# Patient Record
Sex: Male | Born: 1935 | Race: White | Hispanic: No | Marital: Married | Smoking: Never smoker
Health system: Southern US, Community
[De-identification: ages and names within clinical notes are randomized; demographics above are authoritative.]

## PROBLEM LIST (undated history)

## (undated) DIAGNOSIS — F32A Depression, unspecified: Secondary | ICD-10-CM

## (undated) DIAGNOSIS — H547 Unspecified visual loss: Secondary | ICD-10-CM

## (undated) DIAGNOSIS — F419 Anxiety disorder, unspecified: Secondary | ICD-10-CM

## (undated) DIAGNOSIS — Z950 Presence of cardiac pacemaker: Secondary | ICD-10-CM

## (undated) DIAGNOSIS — M1990 Unspecified osteoarthritis, unspecified site: Secondary | ICD-10-CM

## (undated) DIAGNOSIS — F329 Major depressive disorder, single episode, unspecified: Secondary | ICD-10-CM

## (undated) DIAGNOSIS — C801 Malignant (primary) neoplasm, unspecified: Secondary | ICD-10-CM

## (undated) DIAGNOSIS — J45909 Unspecified asthma, uncomplicated: Secondary | ICD-10-CM

## (undated) DIAGNOSIS — E119 Type 2 diabetes mellitus without complications: Secondary | ICD-10-CM

## (undated) HISTORY — PX: PACEMAKER INSERTION: SHX728

## (undated) HISTORY — DX: Unspecified asthma, uncomplicated: J45.909

## (undated) HISTORY — PX: BACK SURGERY: SHX140

## (undated) HISTORY — PX: COLONOSCOPY: SHX174

## (undated) HISTORY — PX: TONSILLECTOMY: SUR1361

## (undated) HISTORY — PX: KNEE ARTHROPLASTY: SHX992

## (undated) HISTORY — PX: CHOLECYSTECTOMY: SHX55

## (undated) HISTORY — PX: TRANSURETHRAL RESECTION OF PROSTATE: SHX73

## (undated) HISTORY — PX: EYE SURGERY: SHX253

## (undated) HISTORY — PX: MOHS SURGERY: SUR867

## (undated) HISTORY — PX: APPENDECTOMY: SHX54

---

## 2017-04-21 ENCOUNTER — Other Ambulatory Visit: Payer: Self-pay | Admitting: Neurosurgery

## 2017-05-15 NOTE — Pre-Procedure Instructions (Signed)
Jose Cervantes  05/15/2017    Your procedure is scheduled on Thursday, May 21, 2017 at 12:45 PM.   Report to [facility] Entrance "A" Admitting Office at 10:45 AM.   Call this number if you have problems the morning of surgery: [phone]   Questions prior to day of surgery, please call [phone] between 8 & 4 PM.   Remember:  Do not eat food or drink liquids after midnight Wednesday, 05/20/17.   Take these medicines the morning of surgery with A SIP OF WATER: Venlafaxine XR (Effexor-SR), Atorvastatin (Lipitor), Tylenol - if needed  Stop NSAIDS (Meloxicam, Ibuprofen, Aleve, etc) as of today. Do not use Aspirin products prior to surgery.   Do not wear jewelry.  Do not wear lotions, powders, cologne or deodorant.  Men may shave face and neck.  Do not bring valuables to the hospital.  [facility] is not responsible for any belongings or valuables.  Contacts, dentures or bridgework may not be worn into surgery.  Leave your suitcase in the car.  After surgery it may be brought to your room.  For patients admitted to the hospital, discharge time will be determined by your treatment team.  [facility] - Preparing for Surgery  Before surgery, you can play an important role.  Because skin is not sterile, your skin needs to be as free of germs as possible.  You can reduce the number of germs on you skin by washing with CHG (chlorahexidine gluconate) soap before surgery.  CHG is an antiseptic cleaner which kills germs and bonds with the skin to continue killing germs even after washing.  Please DO NOT use if you have an allergy to CHG or antibacterial soaps.  If your skin becomes reddened/irritated stop using the CHG and inform your nurse when you arrive at Short Stay.  Do not shave (including legs and underarms) for at least 48 hours prior to the first CHG shower.  You may shave your face.  Please follow these instructions carefully:   1.  Shower with CHG Soap the night  before surgery and the                    morning of Surgery.  2.  If you choose to wash your hair, wash your hair first as usual with your       normal shampoo.  3.  After you shampoo, rinse your hair and body thoroughly to remove the shampoo.  4.  Use CHG as you would any other liquid soap.  You can apply chg directly       to the skin and wash gently with scrungie or a clean washcloth.  5.  Apply the CHG Soap to your body ONLY FROM THE NECK DOWN.        Do not use on open wounds or open sores.  Avoid contact with your eyes, ears, mouth and genitals (private parts).  Wash genitals (private parts) with your normal soap.  6.  Wash thoroughly, paying special attention to the area where your surgery        will be performed.  7.  Thoroughly rinse your body with warm water from the neck down.  8.  DO NOT shower/wash with your normal soap after using and rinsing off       the CHG Soap.  9.  Pat yourself dry with a clean towel.            10.  Wear clean pajamas.  11.  Place clean sheets on your bed the night of your first shower and do not        sleep with pets.  Day of Surgery  Shower as above. Do not apply any lotions/deodorants the morning of surgery.  Please wear clean clothes to the hospital.   Please read over the fact sheets that you were given.

## 2017-05-18 ENCOUNTER — Encounter (HOSPITAL_COMMUNITY)
Admission: RE | Admit: 2017-05-18 | Discharge: 2017-05-18 | Disposition: A | Discharge status: Home / Self Care | Payer: Medicare Other | Source: Ambulatory Visit | Attending: Neurosurgery | Admitting: Neurosurgery

## 2017-05-18 ENCOUNTER — Other Ambulatory Visit: Payer: Self-pay

## 2017-05-18 ENCOUNTER — Encounter (HOSPITAL_COMMUNITY): Payer: Self-pay

## 2017-05-18 ENCOUNTER — Other Ambulatory Visit (HOSPITAL_COMMUNITY): Payer: Self-pay | Admitting: *Deleted

## 2017-05-18 DIAGNOSIS — M5416 Radiculopathy, lumbar region: Secondary | ICD-10-CM | POA: Diagnosis not present

## 2017-05-18 DIAGNOSIS — Z7984 Long term (current) use of oral hypoglycemic drugs: Secondary | ICD-10-CM | POA: Diagnosis not present

## 2017-05-18 DIAGNOSIS — E119 Type 2 diabetes mellitus without complications: Secondary | ICD-10-CM | POA: Diagnosis not present

## 2017-05-18 DIAGNOSIS — F329 Major depressive disorder, single episode, unspecified: Secondary | ICD-10-CM | POA: Diagnosis not present

## 2017-05-18 DIAGNOSIS — M48062 Spinal stenosis, lumbar region with neurogenic claudication: Secondary | ICD-10-CM | POA: Diagnosis present

## 2017-05-18 DIAGNOSIS — Z85828 Personal history of other malignant neoplasm of skin: Secondary | ICD-10-CM | POA: Diagnosis not present

## 2017-05-18 DIAGNOSIS — Z791 Long term (current) use of non-steroidal anti-inflammatories (NSAID): Secondary | ICD-10-CM | POA: Diagnosis not present

## 2017-05-18 HISTORY — DX: Anxiety disorder, unspecified: F41.9

## 2017-05-18 HISTORY — DX: Unspecified osteoarthritis, unspecified site: M19.90

## 2017-05-18 HISTORY — DX: Malignant (primary) neoplasm, unspecified: C80.1

## 2017-05-18 HISTORY — DX: Unspecified visual loss: H54.7

## 2017-05-18 HISTORY — DX: Major depressive disorder, single episode, unspecified: F32.9

## 2017-05-18 HISTORY — DX: Type 2 diabetes mellitus without complications: E11.9

## 2017-05-18 HISTORY — DX: Depression, unspecified: F32.A

## 2017-05-18 LAB — BASIC METABOLIC PANEL
Anion gap: 10 (ref 5–15)
BUN: 19 mg/dL (ref 6–20)
CALCIUM: 9.3 mg/dL (ref 8.9–10.3)
CO2: 22 mmol/L (ref 22–32)
CREATININE: 0.93 mg/dL (ref 0.61–1.24)
Chloride: 107 mmol/L (ref 101–111)
GFR calc Af Amer: >60 mL/min (ref >60)
GLUCOSE: 148 mg/dL — AB (ref 65–99)
Potassium: 4.4 mmol/L (ref 3.5–5.1)
SODIUM: 139 mmol/L (ref 135–145)

## 2017-05-18 LAB — CBC
HEMATOCRIT: 48 % (ref 39.0–52.0)
Hemoglobin: 16.5 g/dL (ref 13.0–17.0)
MCH: 32 pg (ref 26.0–34.0)
MCHC: 34.4 g/dL (ref 30.0–36.0)
MCV: 93.2 fL (ref 78.0–100.0)
PLATELETS: 145 10*3/uL — AB (ref 150–400)
RBC: 5.15 MIL/uL (ref 4.22–5.81)
RDW: 13 % (ref 11.5–15.5)
WBC: 7.8 10*3/uL (ref 4.0–10.5)

## 2017-05-18 LAB — HEMOGLOBIN A1C
HEMOGLOBIN A1C: 5.9 % — AB (ref 4.8–5.6)
MEAN PLASMA GLUCOSE: 122.63 mg/dL

## 2017-05-18 LAB — SURGICAL PCR SCREEN
MRSA, PCR: NEGATIVE
Staphylococcus aureus: NEGATIVE

## 2017-05-18 LAB — GLUCOSE, CAPILLARY: Glucose-Capillary: 174 mg/dL — ABNORMAL HIGH (ref 65–99)

## 2017-05-18 NOTE — Patient Instructions (Signed)
How to Manage Your Diabetes Before Surgery   Why is it important to control my blood sugar before and after surgery?   Improving blood sugar levels before and after surgery helps healing and can limit problems.  A way of improving blood sugar control is eating a healthy diet by:  - Eating less sugar and carbohydrates  - Increasing activity/exercise  - Talk with your doctor about reaching your blood sugar goals  High blood sugars (greater than 180 mg/dL) can raise your risk of infections and slow down your recovery so you will need to focus on controlling your diabetes during the weeks before surgery.  Make sure that the doctor who takes care of your diabetes knows about your planned surgery including the date and location.  How do I manage my blood sugars before surgery?   Check your blood sugar at least 4 times a day, 2 days before surgery to make sure that they are not too high or low.  Check your blood sugar the morning of your surgery when you wake up and every 2 hours until you get to the Short-Stay unit.  Treat a low blood sugar (less than 70 mg/dL) with 1/2 cup of clear juice (cranberry or apple), 4 glucose tablets, OR glucose gel.  Recheck blood sugar in 15 minutes after treatment (to make sure it is greater than 70 mg/dL).  If blood sugar is not greater than 70 mg/dL on re-check, call [phone] for further instructions.   Report your blood sugar to the Short-Stay nurse when you get to Short-Stay.  References:  [facility], 2007 "How to Manage your Diabetes Before and After Surgery".

## 2017-05-18 NOTE — Progress Notes (Signed)
Pt denies cardiac history, chest pain or sob. Pt denies HTN history. Pt is a type 2 diabetic. Last A1C was 5.4 on 02/24/17. He states his fasting blood sugar is usually between 109 -119.

## 2017-05-19 ENCOUNTER — Other Ambulatory Visit: Payer: Self-pay | Admitting: Neurosurgery

## 2017-05-21 ENCOUNTER — Ambulatory Visit (HOSPITAL_COMMUNITY): Payer: Medicare Other

## 2017-05-21 ENCOUNTER — Encounter (HOSPITAL_COMMUNITY): Payer: Self-pay | Admitting: Urology

## 2017-05-21 ENCOUNTER — Ambulatory Visit (HOSPITAL_COMMUNITY): Payer: Medicare Other | Admitting: Certified Registered"

## 2017-05-21 ENCOUNTER — Encounter (HOSPITAL_COMMUNITY)
Admission: RE | Disposition: A | Discharge status: Home / Self Care | Payer: Self-pay | Source: Ambulatory Visit | Attending: Neurosurgery

## 2017-05-21 ENCOUNTER — Inpatient Hospital Stay (HOSPITAL_COMMUNITY)
Admission: RE | Admit: 2017-05-21 | Discharge: 2017-05-21 | DRG: 517 | Disposition: A | Discharge status: Home / Self Care | Payer: Medicare Other | Source: Ambulatory Visit | Attending: Neurosurgery | Admitting: Neurosurgery

## 2017-05-21 DIAGNOSIS — E119 Type 2 diabetes mellitus without complications: Secondary | ICD-10-CM | POA: Diagnosis not present

## 2017-05-21 DIAGNOSIS — Z791 Long term (current) use of non-steroidal anti-inflammatories (NSAID): Secondary | ICD-10-CM

## 2017-05-21 DIAGNOSIS — M5416 Radiculopathy, lumbar region: Secondary | ICD-10-CM | POA: Diagnosis present

## 2017-05-21 DIAGNOSIS — M48062 Spinal stenosis, lumbar region with neurogenic claudication: Secondary | ICD-10-CM | POA: Diagnosis present

## 2017-05-21 DIAGNOSIS — Z85828 Personal history of other malignant neoplasm of skin: Secondary | ICD-10-CM | POA: Diagnosis not present

## 2017-05-21 DIAGNOSIS — Z7984 Long term (current) use of oral hypoglycemic drugs: Secondary | ICD-10-CM | POA: Diagnosis not present

## 2017-05-21 DIAGNOSIS — F329 Major depressive disorder, single episode, unspecified: Secondary | ICD-10-CM | POA: Diagnosis present

## 2017-05-21 DIAGNOSIS — Z419 Encounter for procedure for purposes other than remedying health state, unspecified: Secondary | ICD-10-CM

## 2017-05-21 HISTORY — PX: LUMBAR LAMINECTOMY/DECOMPRESSION MICRODISCECTOMY: SHX5026

## 2017-05-21 LAB — GLUCOSE, CAPILLARY
GLUCOSE-CAPILLARY: 106 mg/dL — AB (ref 65–99)
GLUCOSE-CAPILLARY: 158 mg/dL — AB (ref 65–99)
Glucose-Capillary: 112 mg/dL — ABNORMAL HIGH (ref 65–99)

## 2017-05-21 SURGERY — LUMBAR LAMINECTOMY/DECOMPRESSION MICRODISCECTOMY 3 LEVELS
Anesthesia: General | Site: Spine Lumbar

## 2017-05-21 MED ORDER — INSULIN ASPART 100 UNIT/ML ~~LOC~~ SOLN: 0.0000 [IU] | SUBCUTANEOUS | Status: DC

## 2017-05-21 MED ORDER — ALBUMIN HUMAN 5 % IV SOLN
INTRAVENOUS | Status: DC | PRN
  Administered 2017-05-21: 14:00:00 via INTRAVENOUS

## 2017-05-21 MED ORDER — PHENOL 1.4 % MT LIQD: 1.0000 | OROMUCOSAL | Status: DC | PRN

## 2017-05-21 MED ORDER — CEFAZOLIN SODIUM-DEXTROSE 2-3 GM-%(50ML) IV SOLR
INTRAVENOUS | Status: DC | PRN
  Administered 2017-05-21: 2 g via INTRAVENOUS

## 2017-05-21 MED ORDER — SODIUM CHLORIDE 0.9 % IV SOLN
INTRAVENOUS | Status: DC | PRN
  Administered 2017-05-21: 500 mL

## 2017-05-21 MED ORDER — 0.9 % SODIUM CHLORIDE (POUR BTL) OPTIME
TOPICAL | Status: DC | PRN
  Administered 2017-05-21: 1000 mL

## 2017-05-21 MED ORDER — DOCUSATE SODIUM 100 MG PO CAPS: 100.0000 mg | ORAL_CAPSULE | Freq: Two times a day (BID) | ORAL | Status: DC

## 2017-05-21 MED ORDER — FENTANYL CITRATE (PF) 250 MCG/5ML IJ SOLN
INTRAMUSCULAR | Status: DC | PRN
  Administered 2017-05-21: 100 ug via INTRAVENOUS
  Administered 2017-05-21 (×2): 25 ug via INTRAVENOUS
  Administered 2017-05-21 (×2): 50 ug via INTRAVENOUS

## 2017-05-21 MED ORDER — OXYCODONE HCL 5 MG PO TABS: 5.0000 mg | ORAL_TABLET | Freq: Once | ORAL | Status: DC | PRN

## 2017-05-21 MED ORDER — ONDANSETRON HCL 4 MG/2ML IJ SOLN: 4.0000 mg | Freq: Four times a day (QID) | INTRAMUSCULAR | Status: DC | PRN

## 2017-05-21 MED ORDER — FENTANYL CITRATE (PF) 100 MCG/2ML IJ SOLN: 25.0000 ug | INTRAMUSCULAR | Status: DC | PRN

## 2017-05-21 MED ORDER — LACTATED RINGERS IV SOLN
INTRAVENOUS | Status: DC
  Administered 2017-05-21 (×2): via INTRAVENOUS

## 2017-05-21 MED ORDER — ONDANSETRON HCL 4 MG/2ML IJ SOLN
INTRAMUSCULAR | Status: DC | PRN
  Administered 2017-05-21: 4 mg via INTRAVENOUS

## 2017-05-21 MED ORDER — ONDANSETRON HCL 4 MG/2ML IJ SOLN
INTRAMUSCULAR | Status: AC
  Filled 2017-05-21: qty 4

## 2017-05-21 MED ORDER — SUGAMMADEX SODIUM 200 MG/2ML IV SOLN
INTRAVENOUS | Status: DC | PRN
  Administered 2017-05-21: 200 mg via INTRAVENOUS

## 2017-05-21 MED ORDER — ONDANSETRON HCL 4 MG PO TABS: 4.0000 mg | ORAL_TABLET | Freq: Four times a day (QID) | ORAL | Status: DC | PRN

## 2017-05-21 MED ORDER — BACITRACIN ZINC 500 UNIT/GM EX OINT
TOPICAL_OINTMENT | CUTANEOUS | Status: AC
  Filled 2017-05-21: qty 28.35

## 2017-05-21 MED ORDER — PHENYLEPHRINE 40 MCG/ML (10ML) SYRINGE FOR IV PUSH (FOR BLOOD PRESSURE SUPPORT)
PREFILLED_SYRINGE | INTRAVENOUS | Status: DC | PRN
  Administered 2017-05-21: 120 ug via INTRAVENOUS

## 2017-05-21 MED ORDER — THROMBIN (RECOMBINANT) 5000 UNITS EX SOLR
OROMUCOSAL | Status: DC | PRN
  Administered 2017-05-21: 5 mL via TOPICAL

## 2017-05-21 MED ORDER — MENTHOL 3 MG MT LOZG: 1.0000 | LOZENGE | OROMUCOSAL | Status: DC | PRN

## 2017-05-21 MED ORDER — DOCUSATE SODIUM 100 MG PO CAPS: 100.0000 mg | ORAL_CAPSULE | Freq: Two times a day (BID) | ORAL | 0 refills | Status: DC

## 2017-05-21 MED ORDER — MORPHINE SULFATE (PF) 4 MG/ML IV SOLN: 4.0000 mg | INTRAVENOUS | Status: DC | PRN

## 2017-05-21 MED ORDER — ROCURONIUM BROMIDE 10 MG/ML (PF) SYRINGE
PREFILLED_SYRINGE | INTRAVENOUS | Status: DC | PRN
  Administered 2017-05-21: 50 mg via INTRAVENOUS

## 2017-05-21 MED ORDER — METFORMIN HCL 500 MG PO TABS
500.0000 mg | ORAL_TABLET | Freq: Two times a day (BID) | ORAL | Status: DC
  Filled 2017-05-21: qty 1

## 2017-05-21 MED ORDER — DEXAMETHASONE SODIUM PHOSPHATE 10 MG/ML IJ SOLN
INTRAMUSCULAR | Status: AC
  Filled 2017-05-21: qty 2

## 2017-05-21 MED ORDER — CYCLOBENZAPRINE HCL 10 MG PO TABS: 10.0000 mg | ORAL_TABLET | Freq: Three times a day (TID) | ORAL | Status: DC | PRN

## 2017-05-21 MED ORDER — CEFAZOLIN SODIUM-DEXTROSE 2-4 GM/100ML-% IV SOLN: 2.0000 g | Freq: Three times a day (TID) | INTRAVENOUS | Status: DC

## 2017-05-21 MED ORDER — MELOXICAM 7.5 MG PO TABS
15.0000 mg | ORAL_TABLET | Freq: Every day | ORAL | Status: DC
  Filled 2017-05-21: qty 2

## 2017-05-21 MED ORDER — SODIUM CHLORIDE 0.9% FLUSH: 3.0000 mL | Freq: Two times a day (BID) | INTRAVENOUS | Status: DC

## 2017-05-21 MED ORDER — OXYCODONE HCL 5 MG PO TABS: 10.0000 mg | ORAL_TABLET | ORAL | Status: DC | PRN

## 2017-05-21 MED ORDER — FENTANYL CITRATE (PF) 250 MCG/5ML IJ SOLN
INTRAMUSCULAR | Status: AC
Start: 2017-05-21 — End: 2017-05-21
  Filled 2017-05-21: qty 5

## 2017-05-21 MED ORDER — ACETAMINOPHEN 650 MG RE SUPP: 650.0000 mg | RECTAL | Status: DC | PRN

## 2017-05-21 MED ORDER — CYCLOBENZAPRINE HCL 10 MG PO TABS: 10.0000 mg | ORAL_TABLET | Freq: Three times a day (TID) | ORAL | 0 refills | Status: DC | PRN

## 2017-05-21 MED ORDER — HEMOSTATIC AGENTS (NO CHARGE) OPTIME
TOPICAL | Status: DC | PRN
  Administered 2017-05-21: 1 via TOPICAL

## 2017-05-21 MED ORDER — ATORVASTATIN CALCIUM 20 MG PO TABS
10.0000 mg | ORAL_TABLET | Freq: Every day | ORAL | Status: DC
  Filled 2017-05-21: qty 1

## 2017-05-21 MED ORDER — LIDOCAINE 2% (20 MG/ML) 5 ML SYRINGE
INTRAMUSCULAR | Status: AC
  Filled 2017-05-21: qty 10

## 2017-05-21 MED ORDER — BUPIVACAINE-EPINEPHRINE (PF) 0.5% -1:200000 IJ SOLN
INTRAMUSCULAR | Status: DC | PRN
  Administered 2017-05-21: 10 mL

## 2017-05-21 MED ORDER — PROPOFOL 10 MG/ML IV BOLUS
INTRAVENOUS | Status: DC | PRN
  Administered 2017-05-21: 120 mg via INTRAVENOUS
  Administered 2017-05-21 (×2): 20 mg via INTRAVENOUS

## 2017-05-21 MED ORDER — PROPOFOL 10 MG/ML IV BOLUS
INTRAVENOUS | Status: AC
  Filled 2017-05-21: qty 20

## 2017-05-21 MED ORDER — VENLAFAXINE HCL ER 75 MG PO CP24: 150.0000 mg | ORAL_CAPSULE | Freq: Every day | ORAL | Status: DC

## 2017-05-21 MED ORDER — THROMBIN 5000 UNITS EX SOLR
CUTANEOUS | Status: AC
  Filled 2017-05-21: qty 5000

## 2017-05-21 MED ORDER — OXYCODONE HCL 5 MG PO TABS: 5.0000 mg | ORAL_TABLET | ORAL | 0 refills | Status: DC | PRN

## 2017-05-21 MED ORDER — ACETAMINOPHEN 500 MG PO TABS
1000.0000 mg | ORAL_TABLET | Freq: Four times a day (QID) | ORAL | Status: DC
  Administered 2017-05-21: 1000 mg via ORAL
  Filled 2017-05-21 (×2): qty 2

## 2017-05-21 MED ORDER — LIDOCAINE 2% (20 MG/ML) 5 ML SYRINGE
INTRAMUSCULAR | Status: DC | PRN
  Administered 2017-05-21: 60 mg via INTRAVENOUS

## 2017-05-21 MED ORDER — BUPIVACAINE-EPINEPHRINE (PF) 0.5% -1:200000 IJ SOLN
INTRAMUSCULAR | Status: AC
  Filled 2017-05-21: qty 30

## 2017-05-21 MED ORDER — CEFAZOLIN SODIUM-DEXTROSE 2-4 GM/100ML-% IV SOLN
INTRAVENOUS | Status: AC
  Filled 2017-05-21: qty 100

## 2017-05-21 MED ORDER — BACITRACIN ZINC 500 UNIT/GM EX OINT
TOPICAL_OINTMENT | CUTANEOUS | Status: DC | PRN
  Administered 2017-05-21: 1 via TOPICAL

## 2017-05-21 MED ORDER — ROCURONIUM BROMIDE 10 MG/ML (PF) SYRINGE
PREFILLED_SYRINGE | INTRAVENOUS | Status: AC
  Filled 2017-05-21: qty 10

## 2017-05-21 MED ORDER — BISACODYL 10 MG RE SUPP: 10.0000 mg | Freq: Every day | RECTAL | Status: DC | PRN

## 2017-05-21 MED ORDER — DEXAMETHASONE SODIUM PHOSPHATE 10 MG/ML IJ SOLN
INTRAMUSCULAR | Status: DC | PRN
  Administered 2017-05-21: 10 mg via INTRAVENOUS

## 2017-05-21 MED ORDER — SODIUM CHLORIDE 0.9% FLUSH: 3.0000 mL | INTRAVENOUS | Status: DC | PRN

## 2017-05-21 MED ORDER — PHENYLEPHRINE HCL 10 MG/ML IJ SOLN
INTRAMUSCULAR | Status: DC | PRN
  Administered 2017-05-21: 25 ug/min via INTRAVENOUS

## 2017-05-21 MED ORDER — OXYCODONE HCL 5 MG PO TABS: 5.0000 mg | ORAL_TABLET | ORAL | Status: DC | PRN

## 2017-05-21 MED ORDER — ACETAMINOPHEN 325 MG PO TABS: 650.0000 mg | ORAL_TABLET | ORAL | Status: DC | PRN

## 2017-05-21 MED ORDER — OXYCODONE HCL 5 MG/5ML PO SOLN: 5.0000 mg | Freq: Once | ORAL | Status: DC | PRN

## 2017-05-21 SURGICAL SUPPLY — 57 items
BAG DECANTER FOR FLEXI CONT (MISCELLANEOUS) ×3 IMPLANT
BENZOIN TINCTURE PRP APPL 2/3 (GAUZE/BANDAGES/DRESSINGS) ×3 IMPLANT
BLADE CLIPPER SURG (BLADE) ×3 IMPLANT
BUR MATCHSTICK NEURO 3.0 LAGG (BURR) ×3 IMPLANT
BUR PRECISION FLUTE 6.0 (BURR) ×3 IMPLANT
CANISTER SUCT 3000ML PPV (MISCELLANEOUS) ×3 IMPLANT
CARTRIDGE OIL MAESTRO DRILL (MISCELLANEOUS) ×1 IMPLANT
CLOSURE WOUND 1/2 X4 (GAUZE/BANDAGES/DRESSINGS) ×1
DIFFUSER DRILL AIR PNEUMATIC (MISCELLANEOUS) ×3 IMPLANT
DRAPE LAPAROTOMY 100X72X124 (DRAPES) ×3 IMPLANT
DRAPE MICROSCOPE LEICA (MISCELLANEOUS) IMPLANT
DRAPE POUCH INSTRU U-SHP 10X18 (DRAPES) ×3 IMPLANT
DRAPE SHEET LG 3/4 BI-LAMINATE (DRAPES) ×3 IMPLANT
DRAPE SURG 17X23 STRL (DRAPES) ×12 IMPLANT
DRSG OPSITE POSTOP 4X6 (GAUZE/BANDAGES/DRESSINGS) ×3 IMPLANT
ELECT BLADE 4.0 EZ CLEAN MEGAD (MISCELLANEOUS) ×3
ELECT REM PT RETURN 9FT ADLT (ELECTROSURGICAL) ×3
ELECTRODE BLDE 4.0 EZ CLN MEGD (MISCELLANEOUS) ×1 IMPLANT
ELECTRODE REM PT RTRN 9FT ADLT (ELECTROSURGICAL) ×1 IMPLANT
GAUZE SPONGE 4X4 12PLY STRL (GAUZE/BANDAGES/DRESSINGS) IMPLANT
GAUZE SPONGE 4X4 16PLY XRAY LF (GAUZE/BANDAGES/DRESSINGS) IMPLANT
GLOVE BIO SURGEON STRL SZ7 (GLOVE) ×3 IMPLANT
GLOVE BIO SURGEON STRL SZ8 (GLOVE) ×3 IMPLANT
GLOVE BIO SURGEON STRL SZ8.5 (GLOVE) ×3 IMPLANT
GLOVE BIOGEL PI IND STRL 6.5 (GLOVE) ×1 IMPLANT
GLOVE BIOGEL PI INDICATOR 6.5 (GLOVE) ×2
GLOVE EXAM NITRILE LRG STRL (GLOVE) IMPLANT
GLOVE EXAM NITRILE XL STR (GLOVE) IMPLANT
GLOVE EXAM NITRILE XS STR PU (GLOVE) IMPLANT
GLOVE SURG SS PI 6.5 STRL IVOR (GLOVE) ×3 IMPLANT
GOWN STRL REUS W/ TWL LRG LVL3 (GOWN DISPOSABLE) ×2 IMPLANT
GOWN STRL REUS W/ TWL XL LVL3 (GOWN DISPOSABLE) ×1 IMPLANT
GOWN STRL REUS W/TWL 2XL LVL3 (GOWN DISPOSABLE) IMPLANT
GOWN STRL REUS W/TWL LRG LVL3 (GOWN DISPOSABLE) ×4
GOWN STRL REUS W/TWL XL LVL3 (GOWN DISPOSABLE) ×2
GRAFT DURAGEN MATRIX 1WX1L (Tissue) ×3 IMPLANT
KIT BASIN OR (CUSTOM PROCEDURE TRAY) ×3 IMPLANT
KIT TURNOVER KIT B (KITS) ×3 IMPLANT
NEEDLE HYPO 21X1.5 SAFETY (NEEDLE) IMPLANT
NEEDLE HYPO 22GX1.5 SAFETY (NEEDLE) ×3 IMPLANT
NS IRRIG 1000ML POUR BTL (IV SOLUTION) ×3 IMPLANT
OIL CARTRIDGE MAESTRO DRILL (MISCELLANEOUS) ×3
PACK LAMINECTOMY NEURO (CUSTOM PROCEDURE TRAY) ×3 IMPLANT
PAD ARMBOARD 7.5X6 YLW CONV (MISCELLANEOUS) ×15 IMPLANT
PATTIES SURGICAL .5 X1 (DISPOSABLE) IMPLANT
RUBBERBAND STERILE (MISCELLANEOUS) ×6 IMPLANT
SEALANT ADHERUS EXTEND TIP (MISCELLANEOUS) ×3 IMPLANT
SPONGE SURGIFOAM ABS GEL 100 (HEMOSTASIS) ×3 IMPLANT
SPONGE SURGIFOAM ABS GEL SZ50 (HEMOSTASIS) IMPLANT
STRIP CLOSURE SKIN 1/2X4 (GAUZE/BANDAGES/DRESSINGS) ×2 IMPLANT
SUT PROLENE 6 0 BV (SUTURE) ×3 IMPLANT
SUT VIC AB 1 CT1 18XBRD ANBCTR (SUTURE) ×2 IMPLANT
SUT VIC AB 1 CT1 8-18 (SUTURE) ×4
SUT VIC AB 2-0 CP2 18 (SUTURE) ×6 IMPLANT
TOWEL GREEN STERILE (TOWEL DISPOSABLE) ×3 IMPLANT
TOWEL GREEN STERILE FF (TOWEL DISPOSABLE) ×3 IMPLANT
WATER STERILE IRR 1000ML POUR (IV SOLUTION) ×3 IMPLANT

## 2017-05-21 NOTE — Anesthesia Procedure Notes (Signed)
Procedure Name: Intubation Date/Time: 05/21/2017 12:02 PM Performed by: Imagene Riches, CRNA Pre-anesthesia Checklist: Patient identified, Emergency Drugs available, Suction available and Patient being monitored Patient Re-evaluated:Patient Re-evaluated prior to induction Oxygen Delivery Method: Circle System Utilized Preoxygenation: Pre-oxygenation with 100% oxygen Induction Type: IV induction Ventilation: Mask ventilation without difficulty Laryngoscope Size: Miller and 2 Grade View: Grade II Tube type: Oral Tube size: 7.5 mm Number of attempts: 1 Airway Equipment and Method: Stylet and Oral airway Placement Confirmation: ETT inserted through vocal cords under direct vision,  positive ETCO2 and breath sounds checked- equal and bilateral Secured at: 24 cm Tube secured with: Tape Dental Injury: Teeth and Oropharynx as per pre-operative assessment

## 2017-05-21 NOTE — Op Note (Signed)
Brief history: The patient is an 84 year old white male whose had prior back surgery by another physician many years ago.  He has developed recurrent back, buttock and leg pain consistent with neurogenic claudication.  He has failed medical management and was worked up with a lumbar MRI which demonstrated spinal stenosis.  I discussed the various treatment options with the patient.  He has weighed the risks, benefits and alternatives of surgery and decided proceed with a lumbar laminectomy.  Preoperative diagnosis: Lumbar spinal stenosis with neurogenic claudication, lumbago, lumbar radiculopathy  Postoperative diagnosis: The same  Procedure: L3-4, L4-5 and L5-S1 laminectomy   Surgeon: Dr. Earle Gell  Asst.: None  Anesthesia: Gen. endotracheal  Estimated blood loss: 150 cc  Drains: None  Complications: None  Description of procedure: The patient was brought to the operating room by the anesthesia team. General endotracheal anesthesia was induced. The patient was turned to the prone position on the Wilson frame. The patient's lumbosacral region was then prepared with Betadine scrub and Betadine solution. Sterile drapes were applied.  I then injected the area to be incised with Marcaine with epinephrine solution. I then used a scalpel to make a linear midline incision over the L3-4, L4-5 and L5-S1 intervertebral disc space. I then used electrocautery to perform a bilateral subperiosteal dissection exposing the spinous process and lamina of L3, L4, L5 and the upper sacrum. We obtained intraoperative radiograph to confirm our location. I then inserted the [facility] retractor for exposure.  I began the decompression by incising the interspinous ligament at L3-4, L4-5 and L5-S1.  I used a Leksell rongeur to remove the L3, L4 and L5 spinous process.   I used a high-speed drill to perform a laminotomy at L3-4, L4-5 and L5-S1 bilaterally. I then used a Kerrison punches to widen the laminotomy and  removed the ligamentum flavum at L3-4, L4-5 and L5-S1 bilaterally. We then used microdissection to free up the thecal sac and the bilateral L4, L5 and S1 nerve root from the epidural tissue. I then used a Kerrison punch to perform a foraminotomy at about the bilateral L4, L5 and S1 nerve root.  I inspected the disc at L3-4, L4-5 and L5-S1 bilaterally.  There were no herniations.  I then palpated along the ventral surface of the thecal sac and along exit route of the bilateral L4, L5 and S1 nerve root and noted that the neural structures were well decompressed. This completed the decompression.  I noted the dura was somewhat thin at the L4-5 interspace dorsally, likely from a prior epidural injection.  I laid Gelfoam and DuraSeal over the thin dura.  We then obtained hemostasis using bipolar electrocautery. We irrigated the wound out with bacitracin solution. We then removed the retractor. We then reapproximated the patient's thoracolumbar fascia with interrupted #1 Vicryl suture. We then reapproximated the patient's subcutaneous tissue with interrupted 2-0 Vicryl suture. We then reapproximated patient's skin with Steri-Strips and benzoin. The was then coated with bacitracin ointment. The drapes were removed. The patient was subsequently returned to the supine position where they were extubated by the anesthesia team. The patient was then transported to the postanesthesia care unit in stable condition. All sponge instrument and needle counts were reportedly correct at the end of this case.

## 2017-05-21 NOTE — Progress Notes (Signed)
Subjective: The patient is alert and pleasant.  He looks well.  Objective: Vital signs in last 24 hours: Temp:  [97.5 F (36.4 C)] 97.5 F (36.4 C) (04/18 1111) Pulse Rate:  [68] 68 (04/18 1111) Resp:  [18] 18 (04/18 1111) BP: (145)/(82) 145/82 (04/18 1111) SpO2:  [99 %] 99 % (04/18 1111) Estimated body mass index is 31.55 kg/m as calculated from the following:   Height as of 05/18/17: 5\' 8"  (1.727 m).   Weight as of 05/18/17: 94.1 kg (207 lb 8 oz).   Intake/Output from previous day: No intake/output data recorded. Intake/Output this shift: Total I/O In: 1250 [I.V.:1000; IV Piggyback:250] Out: 350 [Blood:350]  Physical exam the patient is alert and pleasant.  He is moving his lower extremities well.  Lab Results: No results for input(s): WBC, HGB, HCT, PLT in the last 72 hours. BMET No results for input(s): NA, K, CL, CO2, GLUCOSE, BUN, CREATININE, CALCIUM in the last 72 hours.  Studies/Results: No results found.  Assessment/Plan: The patient is doing well.  I spoke with his wife.  LOS: 0 days     Ophelia Charter 05/21/2017, 3:13 PM

## 2017-05-21 NOTE — Discharge Summary (Signed)
Physician Discharge Summary  Patient ID: Jose Cervantes MRN: [id number] DOB/AGE: 82-Mar-1934 82 y.o.  Admit date: 05/21/2017 Discharge date: 05/21/2017  Admission Diagnoses:Lumbar spinal stenosis with neurogenic claudication, lumbar radiculopathy, lumago  Discharge Diagnoses:  The same Active Problems:   Lumbar stenosis with neurogenic claudication   Discharged Condition: good  Hospital Course:  I performed an L3-4, L4-5 and L5-S1 laminectomy and foraminotomy on the patient on 05/21/2017.    The patient's postoperative course was unremarkable.  On the day of surgery the patient requested discharge to home  With his wife who is a Marine scientist.  He was given written and oral discharge instructions.  Consults: physical therapy Significant Diagnostic Studies: none Treatments: L3-4, L4-5 and L5-S1 laminectomy and foraminotomy Discharge Exam: Blood pressure 137/89, pulse 76, temperature 98.6 F (37 C), temperature source Oral, resp. rate 20, SpO2 99 %.  the patient is alert and pleasant.  His strength is grossly normal in his lower extremities.  Disposition:   Home  Discharge Instructions     Remove dressing in 72 hours   Complete by:  As directed    Call MD for:  difficulty breathing, headache or visual disturbances   Complete by:  As directed    Call MD for:  extreme fatigue   Complete by:  As directed    Call MD for:  hives   Complete by:  As directed    Call MD for:  persistant dizziness or light-headedness   Complete by:  As directed    Call MD for:  persistant nausea and vomiting   Complete by:  As directed    Call MD for:  redness, tenderness, or signs of infection (pain, swelling, redness, odor or green/yellow discharge around incision site)   Complete by:  As directed    Call MD for:  severe uncontrolled pain   Complete by:  As directed    Call MD for:  temperature >100.4   Complete by:  As directed    Diet - low sodium heart healthy   Complete by:  As directed    Discharge instructions   Complete by:  As directed    Call [phone] for a followup appointment. Take a stool softener while you are using pain medications.   Driving Restrictions   Complete by:  As directed    Do not drive for 2 weeks.   Increase activity slowly   Complete by:  As directed    Lifting restrictions   Complete by:  As directed    Do not lift more than 5 pounds. No excessive bending or twisting.   May shower / Bathe   Complete by:  As directed    Remove the dressing for 3 days after surgery.  You may shower, but leave the incision alone.     Allergies as of 05/21/2017   No Known Allergies     Medication List    TAKE these medications   acetaminophen 325 MG tablet Commonly known as:  TYLENOL Take 650 mg by mouth every 6 (six) hours as needed (for pain.).   atorvastatin 10 MG tablet Commonly known as:  LIPITOR Take 10 mg by mouth daily.   cyclobenzaprine 10 MG tablet Commonly known as:  FLEXERIL Take 1 tablet (10 mg total) by mouth 3 (three) times daily as needed for muscle spasms.   docusate sodium 100 MG capsule Commonly known as:  COLACE Take 1 capsule (100 mg total) by mouth 2 (two) times daily.   meloxicam 15 MG tablet  Commonly known as:  MOBIC Take 15 mg by mouth daily.   metFORMIN 500 MG tablet Commonly known as:  GLUCOPHAGE Take 500 mg by mouth 2 (two) times daily.   oxyCODONE 5 MG immediate release tablet Commonly known as:  Oxy IR/ROXICODONE Take 1 tablet (5 mg total) by mouth every 4 (four) hours as needed for moderate pain ((score 4 to 6)).   SYSTANE 0.4-0.3 % Soln Generic drug:  Polyethyl Glycol-Propyl Glycol Place 1 drop into both eyes 3 (three) times daily as needed.   venlafaxine XR 150 MG 24 hr capsule Commonly known as:  EFFEXOR-XR Take 150 mg by mouth daily with breakfast.        Signed: Ophelia Charter 05/21/2017, 5:27 PM

## 2017-05-21 NOTE — Anesthesia Postprocedure Evaluation (Signed)
Anesthesia Post Note  Patient: Jose Cervantes  Procedure(s) Performed: LAMINECTOMY AND FORAMINOTOMY LUMBAR THREE- LUMBAR FOUR, LUMBAR FOUR- LUMBAR FIVE, LUMBAR FIVE- SACRAL ONE (N/A Spine Lumbar)     Patient location during evaluation: PACU Anesthesia Type: General Level of consciousness: awake and alert Pain management: pain level controlled Vital Signs Assessment: post-procedure vital signs reviewed and stable Respiratory status: spontaneous breathing, nonlabored ventilation, respiratory function stable and patient connected to nasal cannula oxygen Cardiovascular status: blood pressure returned to baseline and stable Postop Assessment: no apparent nausea or vomiting Anesthetic complications: no    Last Vitals:  Vitals:   05/21/17 1611 05/21/17 1620  BP:  137/89  Pulse:  76  Resp:  20  Temp: 36.6 C 37 C  SpO2:  99%    Last Pain:  Vitals:   05/21/17 1630  TempSrc:   PainSc: 2                  [name] S

## 2017-05-21 NOTE — Anesthesia Preprocedure Evaluation (Signed)
Anesthesia Evaluation  Patient identified by MRN, date of birth, ID band Patient awake    Reviewed: Allergy & Precautions, H&P , NPO status , Patient's Chart, lab work & pertinent test results  Airway Mallampati: II   Neck ROM: full    Dental   Pulmonary neg pulmonary ROS,    breath sounds clear to auscultation       Cardiovascular negative cardio ROS   Rhythm:regular Rate:Normal     Neuro/Psych PSYCHIATRIC DISORDERS Anxiety Depression    GI/Hepatic   Endo/Other  diabetes, Type 2  Renal/GU      Musculoskeletal  (+) Arthritis ,   Abdominal   Peds  Hematology   Anesthesia Other Findings   Reproductive/Obstetrics                             Anesthesia Physical Anesthesia Plan  ASA: II  Anesthesia Plan: General   Post-op Pain Management:    Induction: Intravenous  PONV Risk Score and Plan: 2 and Ondansetron, Dexamethasone and Treatment may vary due to age or medical condition  Airway Management Planned: Oral ETT  Additional Equipment:   Intra-op Plan:   Post-operative Plan: Extubation in OR  Informed Consent: I have reviewed the patients History and Physical, chart, labs and discussed the procedure including the risks, benefits and alternatives for the proposed anesthesia with the patient or authorized representative who has indicated his/her understanding and acceptance.     Plan Discussed with: CRNA, Anesthesiologist and Surgeon  Anesthesia Plan Comments:         Anesthesia Quick Evaluation

## 2017-05-21 NOTE — Transfer of Care (Signed)
Immediate Anesthesia Transfer of Care Note  Patient: Jose Cervantes  Procedure(s) Performed: LAMINECTOMY AND FORAMINOTOMY LUMBAR THREE- LUMBAR FOUR, LUMBAR FOUR- LUMBAR FIVE, LUMBAR FIVE- SACRAL ONE (N/A Spine Lumbar)  Patient Location: PACU  Anesthesia Type:General  Level of Consciousness: awake, alert  and oriented  Airway & Oxygen Therapy: Patient Spontanous Breathing and Patient connected to nasal cannula oxygen  Post-op Assessment: Report given to RN and Post -op Vital signs reviewed and stable  Post vital signs: Reviewed and stable  Last Vitals:  Vitals Value Taken Time  BP    Temp    Pulse 78 05/21/2017  3:40 PM  Resp    SpO2 100 % 05/21/2017  3:40 PM  Vitals shown include unvalidated device data.  Last Pain:  Vitals:   05/21/17 1111  TempSrc: Oral  PainSc:       Patients Stated Pain Goal: 0 (07/62/26 3335)  Complications: No apparent anesthesia complications

## 2017-05-21 NOTE — Progress Notes (Signed)
Pt doing well. Pt and wife given D/C instructions with verbal understanding. Pt's IV was removed prior to D/C. Pt's incision is clean and dry with no sign of infection. Pt D/C'd home via wheelchair @ 1920 per MD order. Pt is stable @ D/C and has no other needs at this time. Holli Humbles, RN

## 2017-05-21 NOTE — Discharge Instructions (Signed)
Call MD for: Difficulty breathing, headache or visual disturbances, extreme fatigue  °Call MD for: hives ° Call MD for: persistant dizziness or light-headedness ° Call MD for: persistant nausea and vomiting  °Call MD for: redness, tenderness, or signs of infection (pain, swelling, redness, odor or green/yellow discharge around incision site)  °Call MD for: severe uncontrolled pain  °Call MD for: temperature >100.4 Diet - low sodium heart healthy Discharge instructions  ° Call [phone] for a followup appointment. °  °Increase activity slowly Remove dressing in 48 hours  ° ° ° ° °Laminectomy, Care After °This sheet gives you information about how to care for yourself after your procedure. Your health care provider may also give you more specific instructions. If you have problems or questions, contact your health care provider. °What can I expect after the procedure? °After the procedure, it is common to have: °· Some pain around your incision area. °· Muscle tightening (spasms) across the back. ° °Follow these instructions at home: °Incision care °· Follow instructions from your health care provider about how to take care of your incision area. Make sure you: °? Wash your hands with soap and water before and after you apply medicine to the area or change your bandage (dressing). If soap and water are not available, use hand sanitizer. °? Change your dressing as told by your health care provider. °? Leave stitches (sutures), skin glue, or adhesive strips in place. These skin closures may need to stay in place for 2 weeks or longer. If adhesive strip edges start to loosen and curl up, you may trim the loose edges. Do not remove adhesive strips completely unless your health care provider tells you to do that. °· Check your incision area every day for signs of infection. Check for: °? More redness, swelling, or pain. °? More fluid or blood. °? Warmth. °? Pus or a bad smell. °Medicines °· Take over-the-counter and  prescription medicines only as told by your health care provider. °· If you were prescribed an antibiotic medicine, use it as told by your health care provider. Do not stop using the antibiotic even if you start to feel better. °Bathing °· Do not take baths, swim, or use a hot tub for 2 weeks, or until your incision has healed completely. °· If your health care provider approves, you may take showers after your dressing has been removed. °Activity °· Return to your normal activities as told by your health care provider. Ask your health care provider what activities are safe for you. °· Avoid bending or twisting at your waist. Always bend at your knees. °· Do not sit for more than 20-30 minutes at a time. Lie down or walk between periods of sitting. °· Do not lift anything that is heavier than 10 lb (4.5 kg) or the limit that your health care provider tells you, until he or she says that it is safe. °· Do not drive for 2 weeks after your procedure or for as long as your health care provider tells you. °· Do not drive or use heavy machinery while taking prescription pain medicine. °General instructions °· To prevent or treat constipation while you are taking prescription pain medicine, your health care provider may recommend that you: °? Drink enough fluid to keep your urine clear or pale yellow. °? Take over-the-counter or prescription medicines. °? Eat foods that are high in fiber, such as fresh fruits and vegetables, whole grains, and beans. °? Limit foods that are high in fat   and processed sugars, such as fried and sweet foods. °· Do breathing exercises as told. °· Keep all follow-up visits as told by your health care provider. This is important. °Contact a health care provider if: °· You have more redness, swelling, or pain around your incision area. °· Your incision feels warm to the touch. °· You are not able to return to activities or do exercises as told by your health care provider. °Get help right away  if: °· You have: °? More fluid or blood coming from your incision area. °? Pus or a bad smell coming from your incision area. °? Chills or a fever. °? Episodes of dizziness or fainting while standing. °· You develop a rash. °· You develop shortness of breath or you have difficulty breathing. °· You cannot control when you urinate or have a bowel movement. °· You become weak. °· You are not able to use your legs. °Summary °· After the procedure, it is common to have some pain around your incision area. You may also have muscle tightening (spasms) across the back. °· Follow instructions from your health care provider about how to care for your incision. °· Do not lift anything that is heavier than 10 lb (4.5 kg) or the limit that your health care provider tells you, until he or she says that it is safe. °· Contact your health care provider if you have more redness, swelling, or pain around your incision area or if your incision feels warm to the touch. These can be signs of infection. °This information is not intended to replace advice given to you by your health care provider. Make sure you discuss any questions you have with your health care provider. °Document Released: 08/09/2004 Document Revised: 09/06/2015 Document Reviewed: 07/08/2015 °[employer] Interactive Patient Education © 2018 [employer] ° °

## 2017-05-21 NOTE — H&P (Signed)
Subjective: The patient is an 84 year old white male who has complained of back and leg pain consistent with neurogenic claudication.  He has failed medical management and was worked up with a lumbar MRI.  This demonstrated the patient has spinal stenosis at L3-4, L4-5 and L5-S1.  I discussed the various treatment options with the patient including surgery.  He has weighed the risks, benefits, and alternatives of surgery and decided proceed with a lumbar laminectomy.  Past Medical History:  Diagnosis Date  . Anxiety   . Arthritis   . 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 Depression   . Diabetes mellitus without complication ([place])   . Vision decreased    right eye since birth    Past Surgical History:  Procedure Laterality Date  . APPENDECTOMY    . BACK SURGERY  [id number], 1984   lumbar laminectomy   . CHOLECYSTECTOMY    . COLONOSCOPY    . EYE SURGERY Left    cataract surgery with lens implant  . KNEE ARTHROPLASTY Bilateral   . MOHS SURGERY Left    ear  . TONSILLECTOMY      No Known Allergies  Social History   Tobacco Use  . Smoking status: Never Smoker  . Smokeless tobacco: Never Used  Substance Use Topics  . Alcohol use: Yes    Comment: social    Family History  Problem Relation Age of Onset  . Diabetes Mellitus II Mother   . Hypertension Mother   . Congestive Heart Failure Father   . Heart disease Father   . Breast cancer Sister   . Bone cancer Sister    Prior to Admission medications   Medication Sig Start Date End Date Taking? Authorizing Provider  acetaminophen (TYLENOL) 325 MG tablet Take 650 mg by mouth every 6 (six) hours as needed (for pain.).   Yes [provider]  atorvastatin (LIPITOR) 10 MG tablet Take 10 mg by mouth daily.   Yes [provider]  meloxicam (MOBIC) 15 MG tablet Take 15 mg by mouth daily.   Yes [provider]  metFORMIN (GLUCOPHAGE) 500 MG tablet Take 500 mg by mouth 2 (two) times daily.   Yes [provider]  Polyethyl Glycol-Propyl Glycol (SYSTANE) 0.4-0.3 % SOLN Place 1 drop into both eyes 3 (three) times daily as needed.   Yes [provider]  venlafaxine XR (EFFEXOR-XR) 150 MG 24 hr capsule Take 150 mg by mouth daily with breakfast.   Yes [provider]     Review of Systems  Positive ROS: As above  All other systems have been reviewed and were otherwise negative with the exception of those mentioned in the HPI and as above.  Objective: Vital signs in last 24 hours: Temp:  [97.5 F (36.4 C)] 97.5 F (36.4 C) (04/18 1111) Pulse Rate:  [68] 68 (04/18 1111) Resp:  [18] 18 (04/18 1111) BP: (145)/(82) 145/82 (04/18 1111) SpO2:  [99 %] 99 % (04/18 1111) Estimated body mass index is 31.55 kg/m as calculated from the following:   Height as of 05/18/17: 5\' 8"  (1.727 m).   Weight as of 05/18/17: 94.1 kg (207 lb 8 oz).   General Appearance: Alert Head: Normocephalic, without obvious abnormality, atraumatic Eyes: PERRL, conjunctiva/corneas clear, EOM's intact,    Ears: Normal  Throat: Normal  Neck: Supple, Back: unremarkable Lungs: Clear to auscultation bilaterally, respirations unlabored Heart: Regular rate and rhythm, no murmur, rub or gallop Abdomen: Soft, non-tender Extremities: Extremities  normal, atraumatic, no cyanosis or edema Skin: unremarkable  NEUROLOGIC:   Mental status: alert and oriented,Motor Exam - grossly normal Sensory Exam - grossly normal Reflexes:  Coordination - grossly normal Gait - grossly normal Balance - grossly normal Cranial Nerves: I: smell Not tested  II: visual acuity  OS: Normal  OD: Normal   II: visual fields Full to confrontation  II: pupils Equal, round, reactive to light  III,VII: ptosis None  III,IV,VI: extraocular muscles  Full ROM  V: mastication Normal  V: facial light touch sensation  Normal  V,VII: corneal reflex  Present  VII: facial muscle function - upper  Normal  VII: facial muscle function -  lower Normal  VIII: hearing Not tested  IX: soft palate elevation  Normal  IX,X: gag reflex Present  XI: trapezius strength  5/5  XI: sternocleidomastoid strength 5/5  XI: neck flexion strength  5/5  XII: tongue strength  Normal    Data Review Lab Results  Component Value Date   WBC 7.8 05/18/2017   HGB 16.5 05/18/2017   HCT 48.0 05/18/2017   MCV 93.2 05/18/2017   PLT 145 (L) 05/18/2017   Lab Results  Component Value Date   NA 139 05/18/2017   K 4.4 05/18/2017   CL 107 05/18/2017   CO2 22 05/18/2017   BUN 19 05/18/2017   CREATININE 0.93 05/18/2017   GLUCOSE 148 (H) 05/18/2017   No results found for: INR, PROTIME  Assessment/Plan: L3-4, L4-5 and L5-S1 spinal stenosis, lumbago, lumbar radiculopathy, neurogenic claudication: I have discussed the situation with the patient and reviewed his imaging studies with him.  We have discussed the various treatment options including surgery.  I have described the surgical treatment option of an L3-4, L4-5 and L5-S1 laminectomy/laminotomy/foraminotomies.  I have shown him surgical models.  I have given him a surgical pamphlet.  We have discussed the risks, benefits, alternatives, expected postoperative course, and likelihood of achieving our goals with surgery.  I have answered all his questions.  He has decided to proceed with surgery.   Ophelia Charter 05/21/2017 11:21 AM

## 2017-05-22 MED FILL — Thrombin For Soln 5000 Unit: CUTANEOUS | Qty: 5000 | Status: AC

## 2017-05-24 ENCOUNTER — Encounter (HOSPITAL_COMMUNITY): Payer: Self-pay | Admitting: Neurosurgery

## 2017-05-26 ENCOUNTER — Other Ambulatory Visit (HOSPITAL_COMMUNITY): Payer: Self-pay

## 2017-08-29 ENCOUNTER — Emergency Department (HOSPITAL_BASED_OUTPATIENT_CLINIC_OR_DEPARTMENT_OTHER): Payer: Medicare Other

## 2017-08-29 ENCOUNTER — Emergency Department (HOSPITAL_BASED_OUTPATIENT_CLINIC_OR_DEPARTMENT_OTHER)
Admission: EM | Admit: 2017-08-29 | Discharge: 2017-08-29 | Disposition: A | Discharge status: Home / Self Care | Payer: Medicare Other | Attending: Emergency Medicine | Admitting: Emergency Medicine

## 2017-08-29 ENCOUNTER — Encounter (HOSPITAL_BASED_OUTPATIENT_CLINIC_OR_DEPARTMENT_OTHER): Payer: Self-pay | Admitting: Emergency Medicine

## 2017-08-29 ENCOUNTER — Other Ambulatory Visit: Payer: Self-pay

## 2017-08-29 DIAGNOSIS — Z85828 Personal history of other malignant neoplasm of skin: Secondary | ICD-10-CM | POA: Insufficient documentation

## 2017-08-29 DIAGNOSIS — E119 Type 2 diabetes mellitus without complications: Secondary | ICD-10-CM | POA: Diagnosis not present

## 2017-08-29 DIAGNOSIS — J069 Acute upper respiratory infection, unspecified: Secondary | ICD-10-CM

## 2017-08-29 DIAGNOSIS — R05 Cough: Secondary | ICD-10-CM | POA: Diagnosis present

## 2017-08-29 DIAGNOSIS — Z79899 Other long term (current) drug therapy: Secondary | ICD-10-CM | POA: Insufficient documentation

## 2017-08-29 DIAGNOSIS — Z7984 Long term (current) use of oral hypoglycemic drugs: Secondary | ICD-10-CM | POA: Diagnosis not present

## 2017-08-29 DIAGNOSIS — B9789 Other viral agents as the cause of diseases classified elsewhere: Secondary | ICD-10-CM

## 2017-08-29 NOTE — Discharge Instructions (Addendum)
If your fever does not go away or you develop shortness of breath, vomiting, or any other new/concerning symptoms then return to the ER for evaluation.

## 2017-08-29 NOTE — ED Triage Notes (Signed)
Fever and cough since yesterday. Just returned from a cruise.

## 2017-08-29 NOTE — ED Provider Notes (Signed)
[facility] EMERGENCY DEPARTMENT Provider Note   CSN: [id number] Arrival date & time: 08/29/17  1111     History   Chief Complaint Chief Complaint  Patient presents with  . Cough  . Fever    HPI Jose Cervantes is a 84 y.o. male.  HPI  84 year old male with a history of diabetes presents with fever and cough.  Started having the cough 2 days ago.  It is a dry cough but persistent and causing his lower chest/upper abdomen to hurt when coughing.  He does not feel short of breath.  He had a fever this morning of 101.  He took some Zicam this morning and has taken Tylenol.  He has not had any significant body aches but has had head congestion, rhinorrhea and sore throat.  He was on a cruise when this started and states he was interacting with someone who was diagnosed with influenza and treated with Tamiflu while on the ship.  He also was interacting with someone who was diagnosed with a cold.  Past Medical History:  Diagnosis Date  . Anxiety   . Arthritis   . Cancer ([place])    Basal cell cancer on ear  . Depression   . Diabetes mellitus without complication ([place])   . Vision decreased    right eye since birth    Patient Active Problem List   Diagnosis Date Noted  . Lumbar stenosis with neurogenic claudication 05/21/2017    Past Surgical History:  Procedure Laterality Date  . APPENDECTOMY    . BACK SURGERY  [id number], 1984   lumbar laminectomy   . CHOLECYSTECTOMY    . COLONOSCOPY    . EYE SURGERY Left    cataract surgery with lens implant  . KNEE ARTHROPLASTY Bilateral   . LUMBAR LAMINECTOMY/DECOMPRESSION MICRODISCECTOMY N/A 05/21/2017   Procedure: LAMINECTOMY AND FORAMINOTOMY LUMBAR THREE- LUMBAR FOUR, LUMBAR FOUR- LUMBAR FIVE, LUMBAR FIVE- SACRAL ONE;  Surgeon: Newman Pies, MD;  Location: [facility];  Service: Neurosurgery;  Laterality: N/A;  . MOHS SURGERY Left    ear  . TONSILLECTOMY          Home Medications    Prior to Admission medications     Medication Sig Start Date End Date Taking? Authorizing Provider  acetaminophen (TYLENOL) 325 MG tablet Take 650 mg by mouth every 6 (six) hours as needed (for pain.).    [provider]  atorvastatin (LIPITOR) 10 MG tablet Take 10 mg by mouth daily.    [provider]  cyclobenzaprine (FLEXERIL) 10 MG tablet Take 1 tablet (10 mg total) by mouth 3 (three) times daily as needed for muscle spasms. 05/21/17   Newman Pies, MD  docusate sodium (COLACE) 100 MG capsule Take 1 capsule (100 mg total) by mouth 2 (two) times daily. 05/21/17   Newman Pies, MD  meloxicam (MOBIC) 15 MG tablet Take 15 mg by mouth daily.    [provider]  metFORMIN (GLUCOPHAGE) 500 MG tablet Take 500 mg by mouth 2 (two) times daily.    [provider]  oxyCODONE (OXY IR/ROXICODONE) 5 MG immediate release tablet Take 1 tablet (5 mg total) by mouth every 4 (four) hours as needed for moderate pain ((score 4 to 6)). 05/21/17   Newman Pies, MD  Polyethyl Glycol-Propyl Glycol (SYSTANE) 0.4-0.3 % SOLN Place 1 drop into both eyes 3 (three) times daily as needed.    [provider]  venlafaxine XR (EFFEXOR-XR) 150 MG 24 hr capsule Take 150 mg by mouth  daily with breakfast.    [provider]    Family History Family History  Problem Relation Age of Onset  . Diabetes Mellitus II Mother   . Hypertension Mother   . Congestive Heart Failure Father   . Heart disease Father   . Breast cancer Sister   . Bone cancer Sister     Social History Social History   Tobacco Use  . Smoking status: Never Smoker  . Smokeless tobacco: Never Used  Substance Use Topics  . Alcohol use: Yes    Comment: social  . Drug use: Never     Allergies   Patient has no known allergies.   Review of Systems Review of Systems  Constitutional: Positive for fever.  HENT: Positive for congestion, rhinorrhea and sore throat.   Respiratory: Positive for cough. Negative for shortness of  breath.   Musculoskeletal: Positive for myalgias (mild).  All other systems reviewed and are negative.    Physical Exam Updated Vital Signs BP (!) 170/88 (BP Location: Left Arm)   Pulse 94   Temp (!) 101.3 F (38.5 C) (Oral)   Resp 20   Ht 5\' 8"  (1.727 m)   Wt 92.1 kg (203 lb)   SpO2 98%   BMI 30.87 kg/m   Physical Exam  Constitutional: He is oriented to person, place, and time. He appears well-developed and well-nourished. No distress.  HENT:  Head: Normocephalic and atraumatic.  Right Ear: External ear normal.  Left Ear: External ear normal.  Nose: Nose normal.  Mouth/Throat: Uvula is midline. No uvula swelling. Posterior oropharyngeal erythema (mild) present. No oropharyngeal exudate.  Eyes: Right eye exhibits no discharge. Left eye exhibits no discharge.  Neck: Neck supple.  Cardiovascular: Normal rate, regular rhythm and normal heart sounds.  Pulmonary/Chest: Effort normal and breath sounds normal. He has no wheezes. He has no rales.  Abdominal: Soft. There is no tenderness.  Musculoskeletal: He exhibits no edema.  Neurological: He is alert and oriented to person, place, and time.  Skin: Skin is warm and dry. He is not diaphoretic.  Nursing note and vitals reviewed.    ED Treatments / Results  Labs (all labs ordered are listed, but only abnormal results are displayed) Labs Reviewed - No data to display  EKG None  Radiology Dg Chest 2 View  Result Date: 08/29/2017 CLINICAL DATA:  Cough and fever following recent travel EXAM: CHEST - 2 VIEW COMPARISON:  03/15/2015 FINDINGS: The heart size and mediastinal contours are within normal limits. Both lungs are clear. The visualized skeletal structures are unremarkable. IMPRESSION: No active cardiopulmonary disease. Electronically Signed   By: Inez Catalina M.D.   On: 08/29/2017 11:51    Procedures Procedures (including critical care time)  Medications Ordered in ED Medications - No data to display   Initial  Impression / Assessment and Plan / ED Course  I have reviewed the triage vital signs and the nursing notes.  Pertinent labs & imaging results that were available during my care of the patient were reviewed by me and considered in my medical decision making (see chart for details).     Patient tells me that the person with the flu on the cruise ship actually tested positive for the flu.  I offered Tamiflu as he could have a mild influenza versus some other viral respiratory infection.  However after discussion of risks/benefits he declines.  I do not think flu testing in this scenario would be very helpful then.  Chest x-ray and lungs  are clear.  I do not think this is bacterial.  I think otherwise he needs supportive care for what is likely a viral upper respiratory infection.  We discussed over-the-counter remedies.  Return precautions.  Final Clinical Impressions(s) / ED Diagnoses   Final diagnoses:  Viral upper respiratory tract infection with cough    ED Discharge Orders    None       Sherwood Gambler, MD 08/29/17 1545

## 2017-08-29 NOTE — ED Notes (Signed)
Pt's wife requests blanket for pt because he has chills; temp rechecked, 101.3. Sheet provided to pt d/t fever.

## 2018-11-25 DIAGNOSIS — M25562 Pain in left knee: Secondary | ICD-10-CM | POA: Insufficient documentation

## 2019-06-08 IMAGING — CR DG LUMBAR SPINE 2-3V
2 series · 2 of 2 positions shown · non-contrast
Comparison: MRI lumbar spine April 14, 2017

CLINICAL DATA: Localization for laminectomy.

EXAM:
LUMBAR SPINE - 2-3 VIEW

[lateral (1 of 2)]
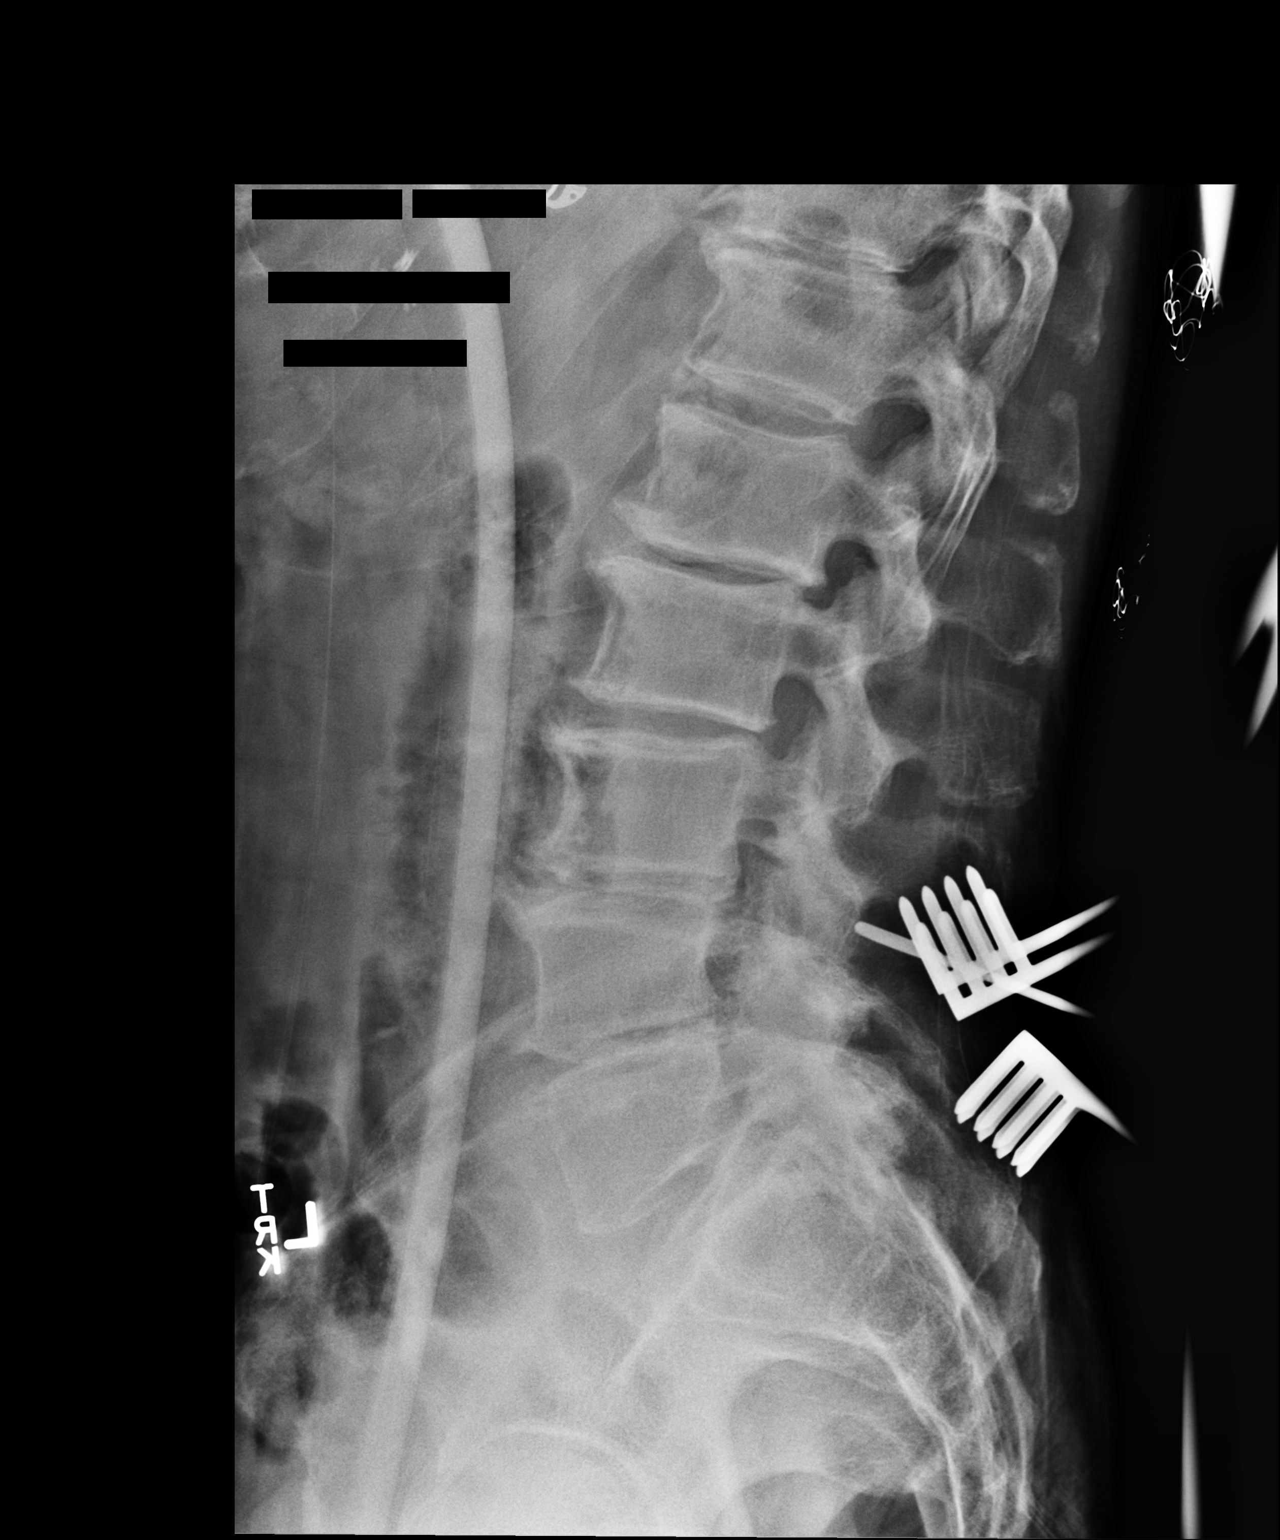

[lateral (2 of 2)]
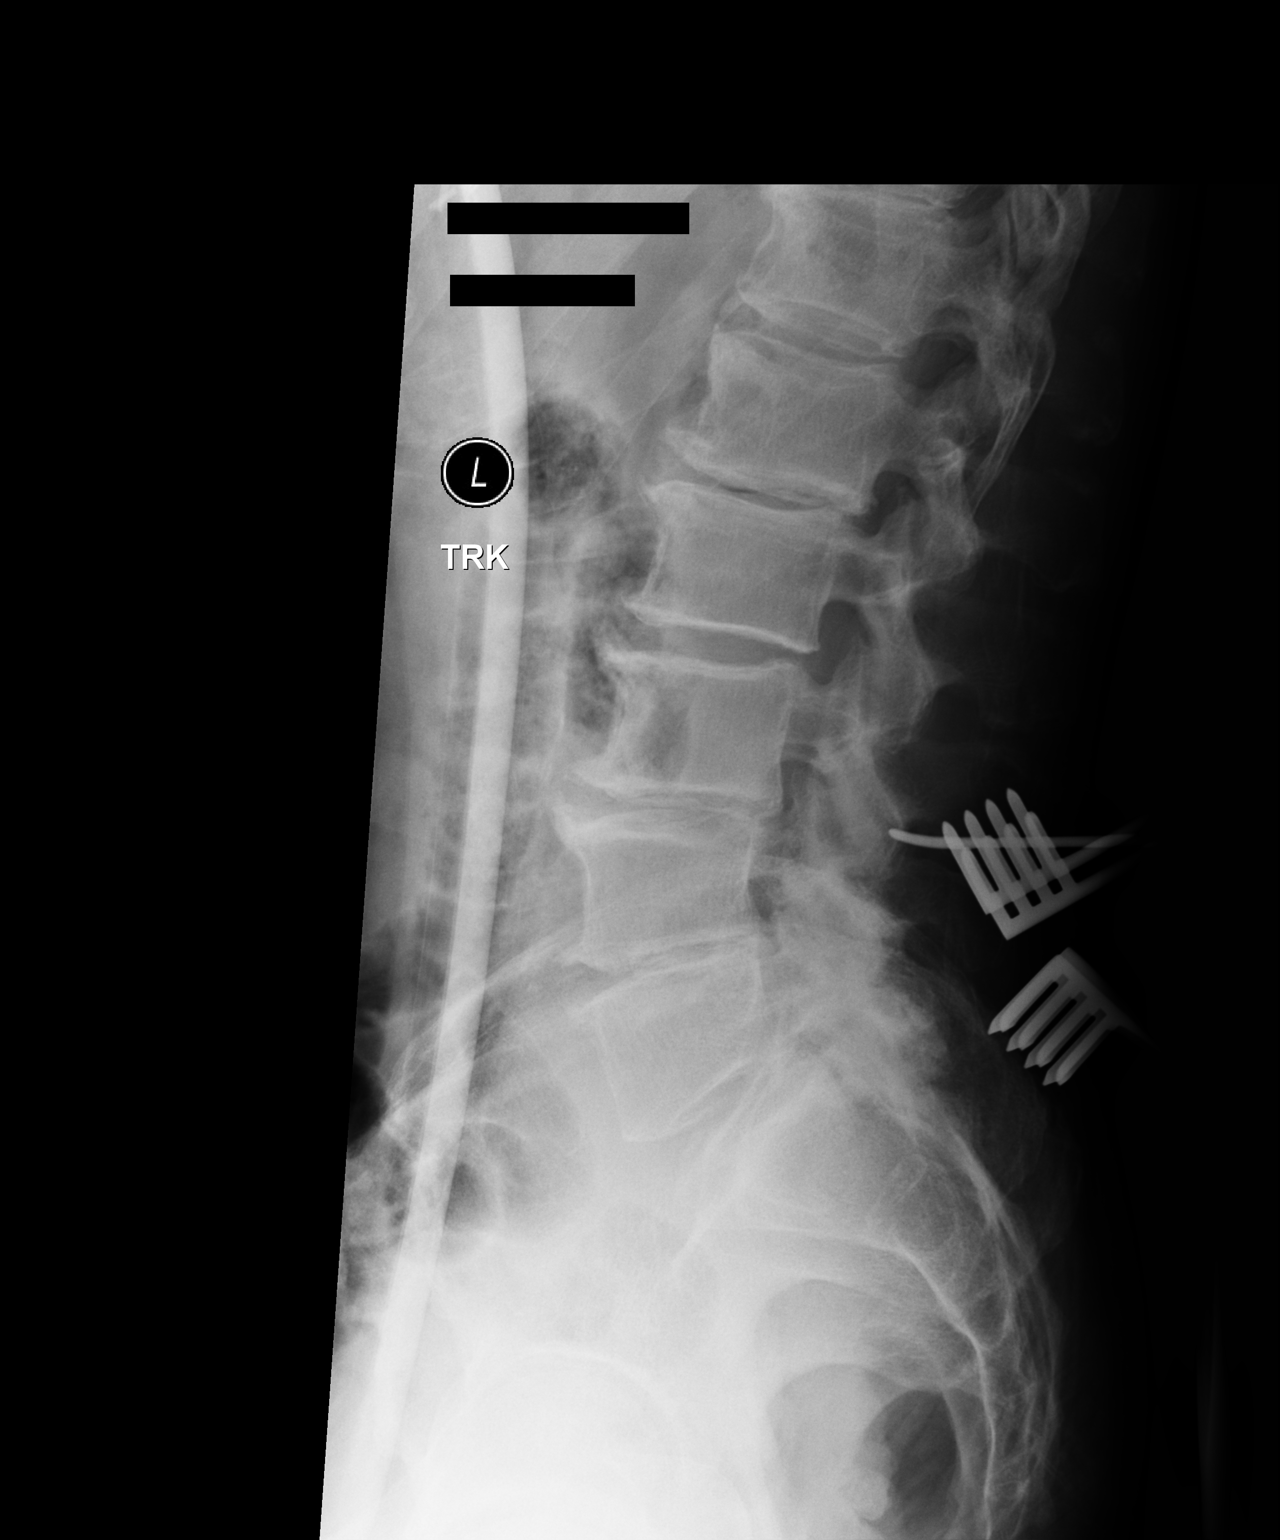

[2 of 2 positions shown; findings below may reference images not displayed]

FINDINGS: Intraoperative lateral radiographs for localization. Film 1 and film
2 demonstrate posterior elements of L3-4.
IMPRESSION: Intraoperative localization indicating posterior elements of L3-4.

## 2019-09-16 IMAGING — DX DG CHEST 2V
2 series · 2 of 2 positions shown · non-contrast
Comparison: 03/15/2015

CLINICAL DATA: Cough and fever following recent travel

EXAM:
CHEST - 2 VIEW

[chest pa]
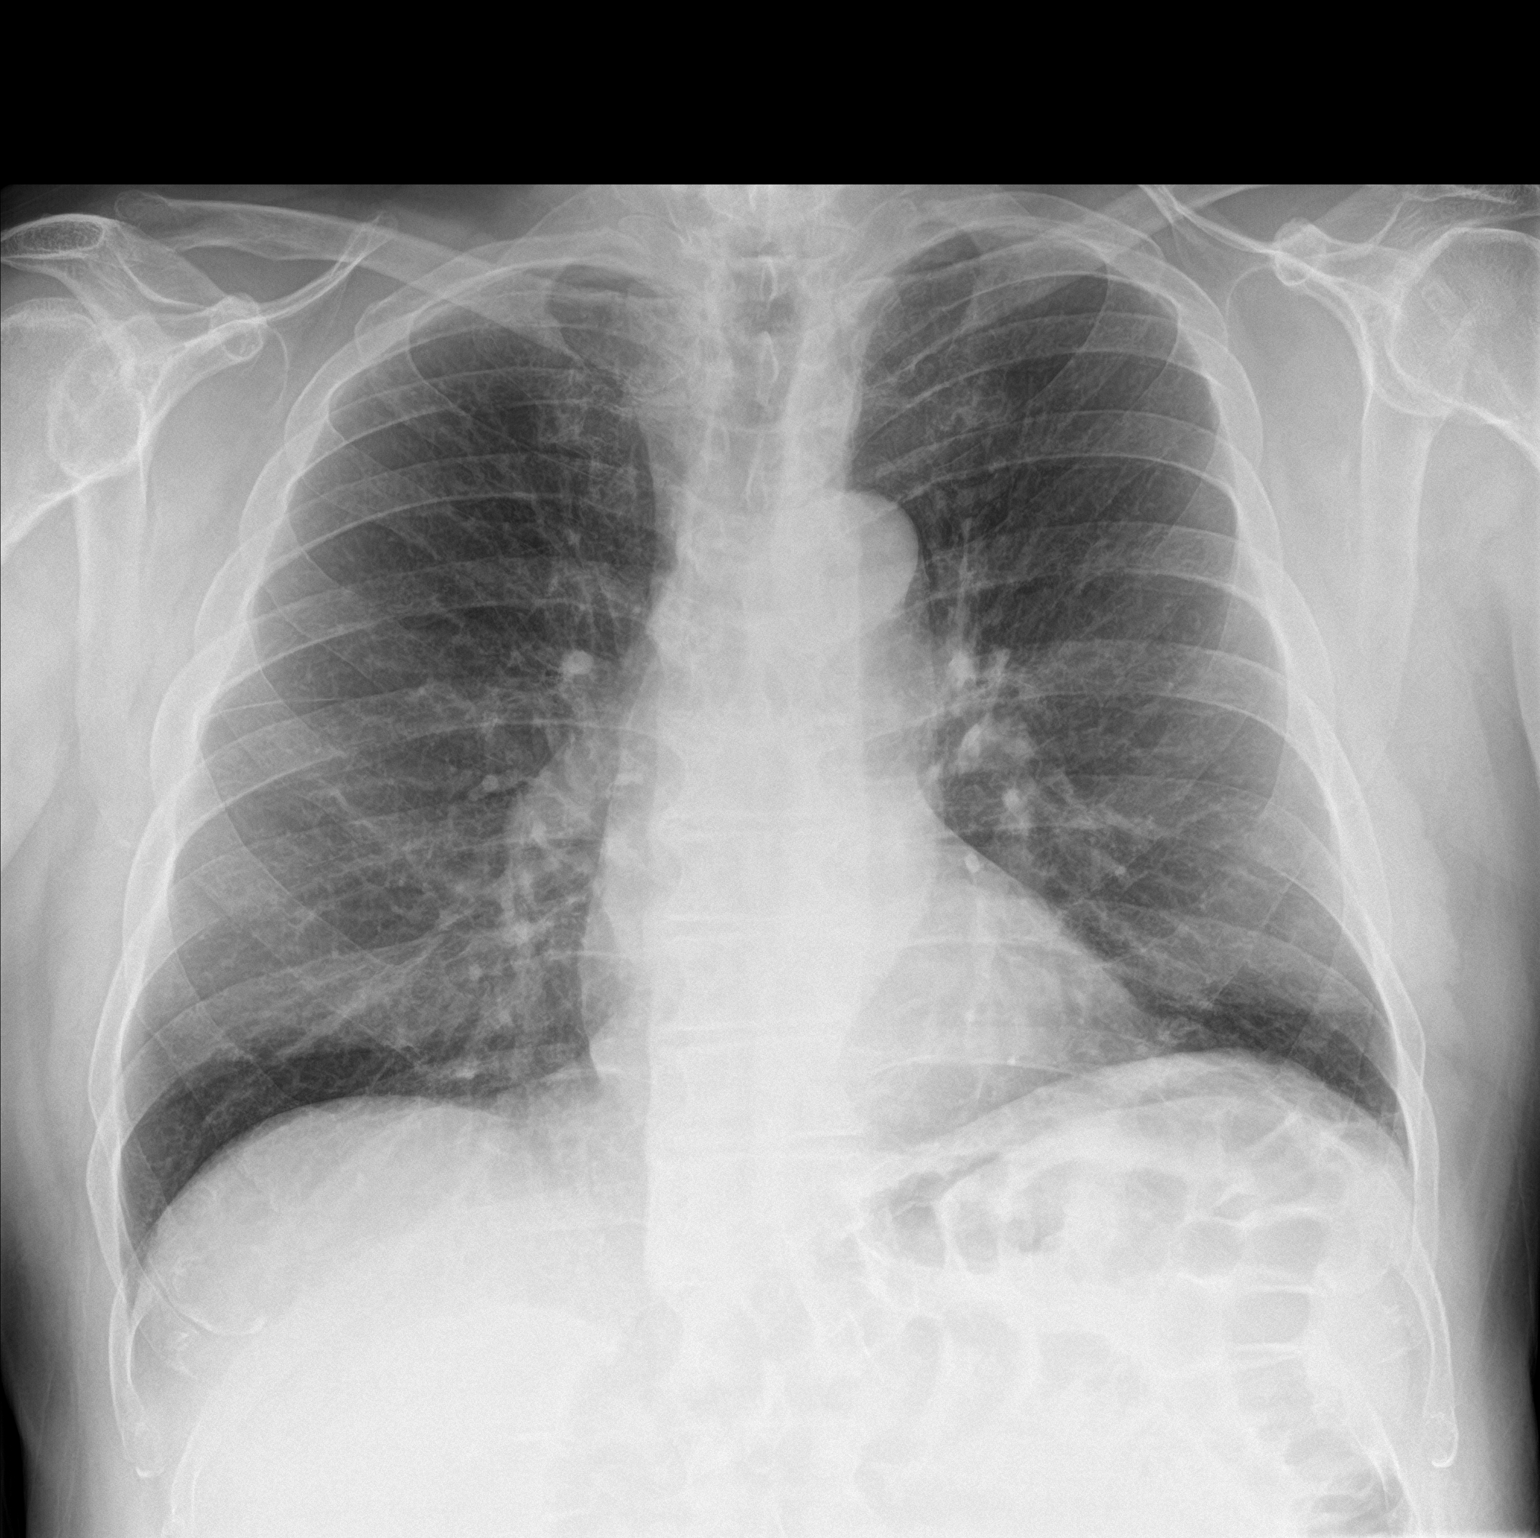

[chest lat]
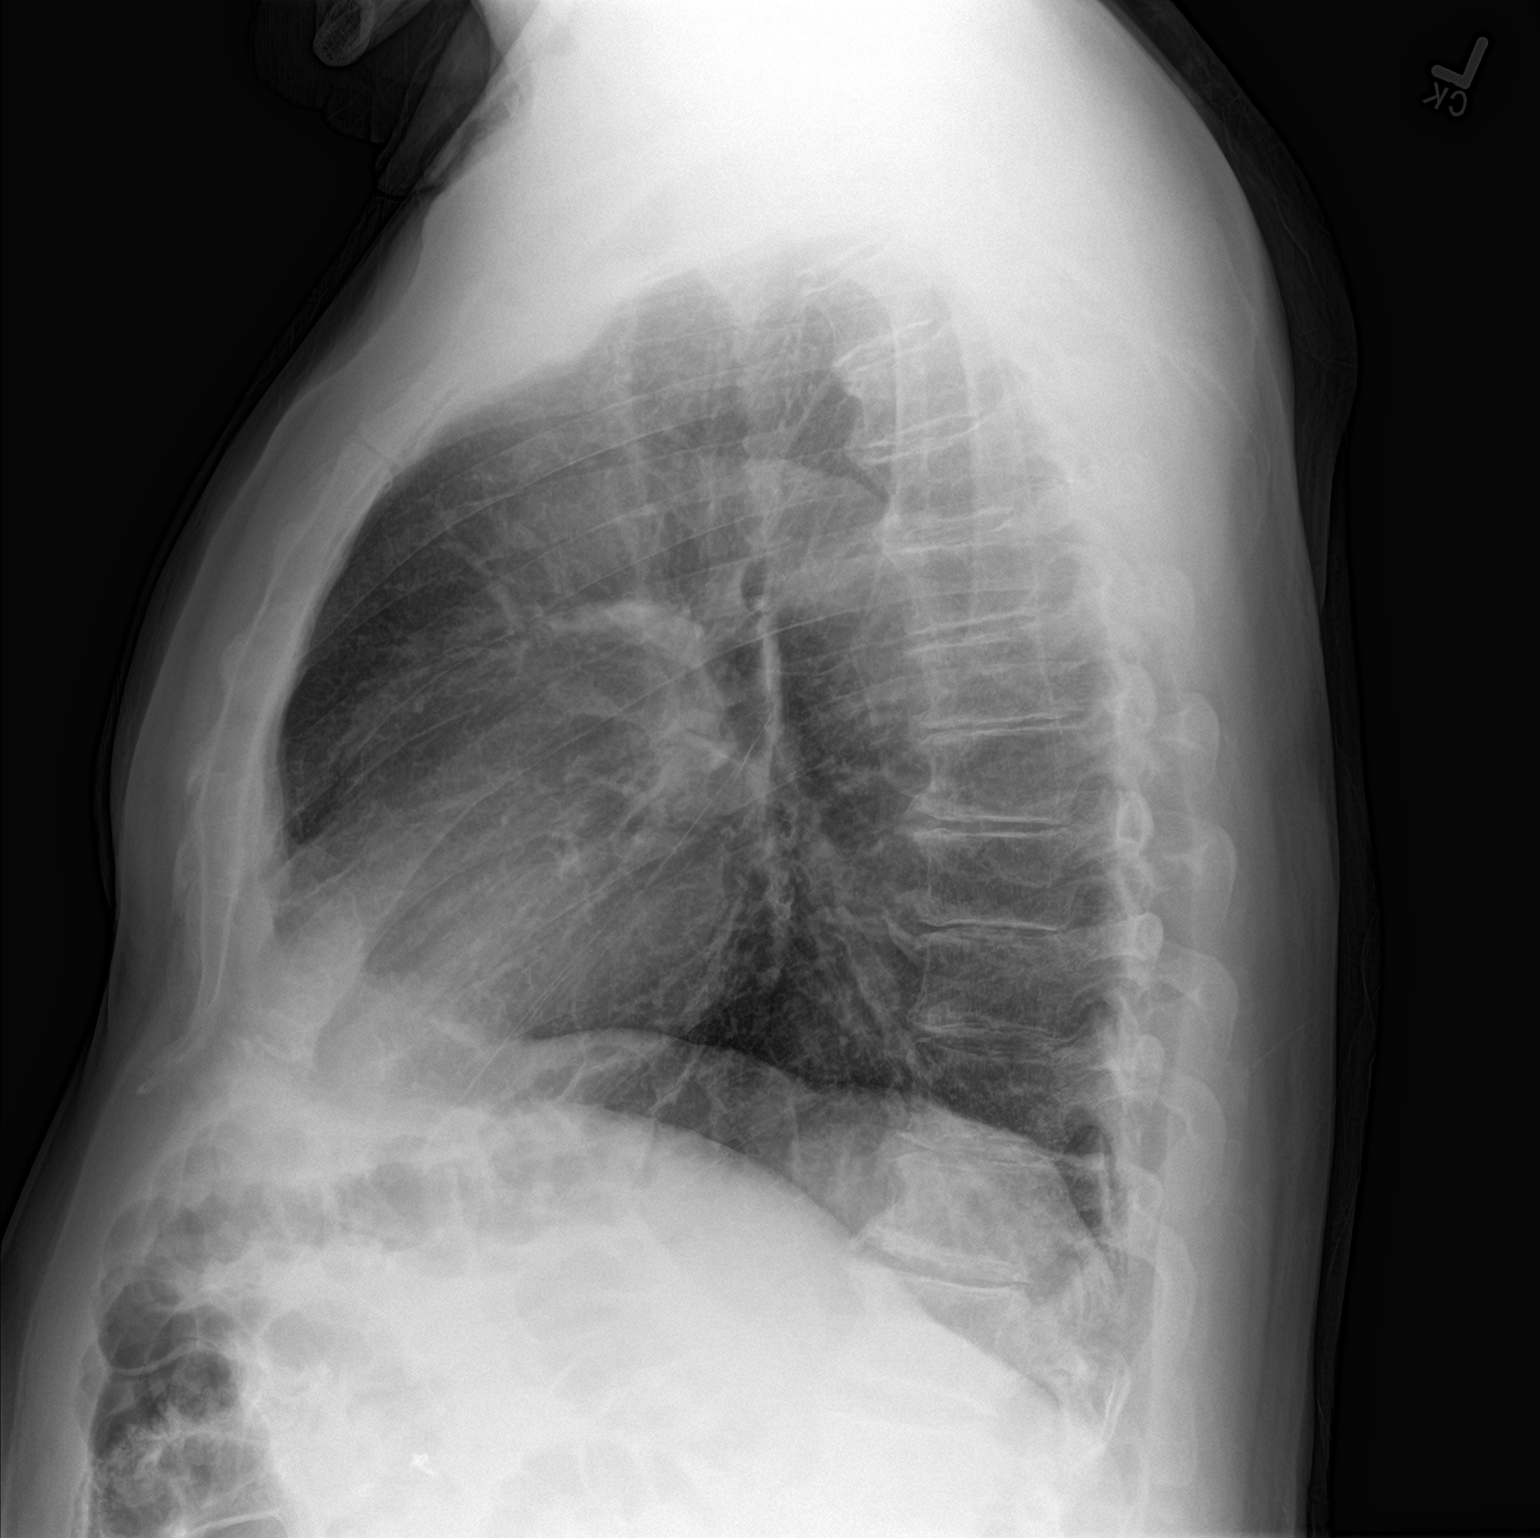

[2 of 2 positions shown; findings below may reference images not displayed]

FINDINGS: The heart size and mediastinal contours are within normal limits.
Both lungs are clear. The visualized skeletal structures are
unremarkable.
IMPRESSION: No active cardiopulmonary disease.

## 2021-07-04 ENCOUNTER — Other Ambulatory Visit: Payer: Self-pay | Admitting: Physician Assistant

## 2021-07-04 DIAGNOSIS — M6281 Muscle weakness (generalized): Secondary | ICD-10-CM

## 2021-07-06 ENCOUNTER — Ambulatory Visit
Admission: RE | Admit: 2021-07-06 | Discharge: 2021-07-06 | Disposition: A | Discharge status: Home / Self Care | Payer: Medicare Other | Source: Ambulatory Visit | Attending: Physician Assistant | Admitting: Physician Assistant

## 2021-07-06 DIAGNOSIS — M6281 Muscle weakness (generalized): Secondary | ICD-10-CM

## 2022-05-27 ENCOUNTER — Institutional Professional Consult (permissible substitution): Payer: Medicare Other | Admitting: Student

## 2022-07-02 ENCOUNTER — Other Ambulatory Visit: Payer: Self-pay | Admitting: Orthopedic Surgery

## 2022-07-02 DIAGNOSIS — M25552 Pain in left hip: Secondary | ICD-10-CM

## 2022-07-07 ENCOUNTER — Ambulatory Visit
Admission: RE | Admit: 2022-07-07 | Discharge: 2022-07-07 | Disposition: A | Discharge status: Home / Self Care | Payer: Medicare Other | Source: Ambulatory Visit | Attending: Orthopedic Surgery | Admitting: Orthopedic Surgery

## 2022-07-07 DIAGNOSIS — M25552 Pain in left hip: Secondary | ICD-10-CM

## 2022-07-29 NOTE — Progress Notes (Signed)
COVID Vaccine received:  []  No [x]  Yes Date of any COVID positive Test in last 90 days: No PCP -Nyra Jabs PA-C Cardiologist - Dr. Dyane Dustman [facility]  Cardiac clearance Dr. Chales Abrahams 06/03/22  Chest x-ray - Spring 2024  CEW EKG -   Stress Test -  ECHO -  Cardiac Cath - No  Bowel Prep - [x]  No  []   Yes ______  Pacemaker / ICD device []  No [x]  Yes   Spinal Cord Stimulator:[x]  No []  Yes       History of Sleep Apnea? [x]  No []  Yes   CPAP used?- [x]  No []  Yes    Does the patient monitor blood sugar?          [x]  No []  Yes  []  N/A  Patient has: []  NO Hx DM   []  Pre-DM                 []  DM1  [x]   DM2 Does patient have a [employer] or Dexacom? [x]  No []  Yes   Fasting Blood Sugar Ranges- 110-130 Checks Blood Sugar ____1_ times a week  GLP1 agonist / usual dose - No GLP1 instructions:  SGLT-2 inhibitors / usual dose - No SGLT-2 instructions:   Blood Thinner / Instructions:No Aspirin Instructions:No  Comments:   Activity level: Patient is able to climb a flight of stairs without difficulty; []  No CP  [x]  No SOB,    Patient canperform ADLs without assistance.   Anesthesia review: DM, Pacemaker, 2nd degree AV block  Patient denies shortness of breath, fever, cough and chest pain at PAT appointment.  Patient verbalized understanding and agreement to the Pre-Surgical Instructions that were given to them at this PAT appointment. Patient was also educated of the need to review these PAT instructions again prior to his/her surgery.I reviewed the appropriate phone numbers to call if they have any and questions or concerns.

## 2022-07-29 NOTE — Patient Instructions (Signed)
SURGICAL WAITING ROOM VISITATION  Patients having surgery or a procedure may have no more than 2 support people in the waiting area - these visitors may rotate.    Children under the age of 28 must have an adult with them who is not the patient.  Due to an increase in RSV and influenza rates and associated hospitalizations, children ages 38 and under may not visit patients in [facility] hospitals.  If the patient needs to stay at the hospital during part of their recovery, the visitor guidelines for inpatient rooms apply. Pre-op nurse will coordinate an appropriate time for 1 support person to accompany patient in pre-op.  This support person may not rotate.    Please refer to the [facility] website for the visitor guidelines for Inpatients (after your surgery is over and you are in a regular room).       Your procedure is scheduled on: 08/04/22   Report to [facility] Main Entrance    Report to admitting at 10:10 AM   Call this number if you have problems the morning of surgery [phone]   Do not eat food :After Midnight.   After Midnight you may have the following liquids until 9:40 AM DAY OF SURGERY  Water Non-Citrus Juices (without pulp, NO RED-Apple, White grape, White cranberry) Black Coffee (NO MILK/CREAM OR CREAMERS, sugar ok)  Clear Tea (NO MILK/CREAM OR CREAMERS, sugar ok) regular and decaf                             Plain Jell-O (NO RED)                                           Fruit ices (not with fruit pulp, NO RED)                                     Popsicles (NO RED)                                                               Sports drinks like Gatorade (NO RED)                  The day of surgery:  Drink ONE (1) Pre-Surgery G2 at 9:40 AM the morning of surgery. Drink in one sitting. Do not sip.  This drink was given to you during your hospital  pre-op appointment visit. Nothing else to drink after completing the  Pre-Surgery G2.          If  you have questions, please contact your surgeon's office.     Oral Hygiene is also important to reduce your risk of infection.                                    Remember - BRUSH YOUR TEETH THE MORNING OF SURGERY WITH YOUR REGULAR TOOTHPASTE  DENTURES WILL BE REMOVED PRIOR TO SURGERY PLEASE DO NOT APPLY "Poly grip" OR ADHESIVES!!!  Do NOT smoke after Midnight   Take these medicines the morning of surgery: Tylenol if needed, Atorvastatin, Tramadol if needed  DO NOT TAKE ANY ORAL DIABETIC MEDICATIONS DAY OF YOUR SURGERY             You may not have any metal on your body including hair pins, jewelry, and body piercing             Do not wear make-up, lotions, powders, cologne, or deodorant              Men may shave face and neck.   Do not bring valuables to the hospital. [facility] IS NOT             RESPONSIBLE   FOR VALUABLES.   Contacts, glasses, dentures or bridgework may not be worn into surgery.   Bring small overnight bag day of surgery.   DO NOT BRING YOUR HOME MEDICATIONS TO THE HOSPITAL. PHARMACY WILL DISPENSE MEDICATIONS LISTED ON YOUR MEDICATION LIST TO YOU DURING YOUR ADMISSION IN THE HOSPITAL!    Patients discharged on the day of surgery will not be allowed to drive home.  Someone NEEDS to stay with you for the first 24 hours after anesthesia.   Special Instructions: Bring a copy of your healthcare power of attorney and living will documents the day of surgery if you haven't scanned them before.              Please read over the following fact sheets you were given: IF YOU HAVE QUESTIONS ABOUT YOUR PRE-OP INSTRUCTIONS PLEASE CALL [phone]   If you received a COVID test during your pre-op visit  it is requested that you wear a mask when out in public, stay away from anyone that may not be feeling well and notify your surgeon if you develop symptoms. If you test positive for Covid or have been in contact with anyone that has tested positive in the last 10 days  please notify you surgeon.      Pre-operative 5 CHG Bath Instructions   You can play a key role in reducing the risk of infection after surgery. Your skin needs to be as free of germs as possible. You can reduce the number of germs on your skin by washing with CHG (chlorhexidine gluconate) soap before surgery. CHG is an antiseptic soap that kills germs and continues to kill germs even after washing.   DO NOT use if you have an allergy to chlorhexidine/CHG or antibacterial soaps. If your skin becomes reddened or irritated, stop using the CHG and notify one of our RNs at [phone].   Please shower with the CHG soap starting 4 days before surgery using the following schedule:     Please keep in mind the following:  DO NOT shave, including legs and underarms, starting the day of your first shower.   You may shave your face at any point before/day of surgery.  Place clean sheets on your bed the day you start using CHG soap. Use a clean washcloth (not used since being washed) for each shower. DO NOT sleep with pets once you start using the CHG.   CHG Shower Instructions:  If you choose to wash your hair and private area, wash first with your normal shampoo/soap.  After you use shampoo/soap, rinse your hair and body thoroughly to remove shampoo/soap residue.  Turn the water OFF and apply about 3 tablespoons (45 ml) of CHG soap to a CLEAN washcloth.  Apply CHG  soap ONLY FROM YOUR NECK DOWN TO YOUR TOES (washing for 3-5 minutes)  DO NOT use CHG soap on face, private areas, open wounds, or sores.  Pay special attention to the area where your surgery is being performed.  If you are having back surgery, having someone wash your back for you may be helpful. Wait 2 minutes after CHG soap is applied, then you may rinse off the CHG soap.  Pat dry with a clean towel  Put on clean clothes/pajamas   If you choose to wear lotion, please use ONLY the CHG-compatible lotions on the back of this paper.      Additional instructions for the day of surgery: DO NOT APPLY any lotions, deodorants, cologne, or perfumes.   Put on clean/comfortable clothes.  Brush your teeth.  Ask your nurse before applying any prescription medications to the skin.      CHG Compatible Lotions   Aveeno Moisturizing lotion  Cetaphil Moisturizing Cream  Cetaphil Moisturizing Lotion  Clairol Herbal Essence Moisturizing Lotion, Dry Skin  Clairol Herbal Essence Moisturizing Lotion, Extra Dry Skin  Clairol Herbal Essence Moisturizing Lotion, Normal Skin  Curel Age Defying Therapeutic Moisturizing Lotion with Alpha Hydroxy  Curel Extreme Care Body Lotion  Curel Soothing Hands Moisturizing Hand Lotion  Curel Therapeutic Moisturizing Cream, Fragrance-Free  Curel Therapeutic Moisturizing Lotion, Fragrance-Free  Curel Therapeutic Moisturizing Lotion, Original Formula  Eucerin Daily Replenishing Lotion  Eucerin Dry Skin Therapy Plus Alpha Hydroxy Crme  Eucerin Dry Skin Therapy Plus Alpha Hydroxy Lotion  Eucerin Original Crme  Eucerin Original Lotion  Eucerin Plus Crme Eucerin Plus Lotion  Eucerin TriLipid Replenishing Lotion  Keri Anti-Bacterial Hand Lotion  Keri Deep Conditioning Original Lotion Dry Skin Formula Softly Scented  Keri Deep Conditioning Original Lotion, Fragrance Free Sensitive Skin Formula  Keri Lotion Fast Absorbing Fragrance Free Sensitive Skin Formula  Keri Lotion Fast Absorbing Softly Scented Dry Skin Formula  Keri Original Lotion  Keri Skin Renewal Lotion Keri Silky Smooth Lotion  Keri Silky Smooth Sensitive Skin Lotion  Nivea Body Creamy Conditioning Oil  Nivea Body Extra Enriched Lotion  Nivea Body Original Lotion  Nivea Body Sheer Moisturizing Lotion Nivea Crme  Nivea Skin Firming Lotion  NutraDerm 30 Skin Lotion  NutraDerm Skin Lotion  NutraDerm Therapeutic Skin Cream  NutraDerm Therapeutic Skin Lotion  ProShield Protective Hand Cream  Provon moisturizing lotion How to Manage  Your Diabetes Before and After Surgery  Why is it important to control my blood sugar before and after surgery? Improving blood sugar levels before and after surgery helps healing and can limit problems. A way of improving blood sugar control is eating a healthy diet by:  Eating less sugar and carbohydrates  Increasing activity/exercise  Talking with your doctor about reaching your blood sugar goals High blood sugars (greater than 180 mg/dL) can raise your risk of infections and slow your recovery, so you will need to focus on controlling your diabetes during the weeks before surgery. Make sure that the doctor who takes care of your diabetes knows about your planned surgery including the date and location.  How do I manage my blood sugar before surgery? Check your blood sugar at least 4 times a day, starting 2 days before surgery, to make sure that the level is not too high or low. Check your blood sugar the morning of your surgery when you wake up and every 2 hours until you get to the Short Stay unit. If your blood sugar is less than 70 mg/dL, you  will need to treat for low blood sugar: Do not take insulin. Treat a low blood sugar (less than 70 mg/dL) with  cup of clear juice (cranberry or apple), 4 glucose tablets, OR glucose gel. Recheck blood sugar in 15 minutes after treatment (to make sure it is greater than 70 mg/dL). If your blood sugar is not greater than 70 mg/dL on recheck, call [phone] for further instructions. Report your blood sugar to the short stay nurse when you get to Short Stay.  If you are admitted to the hospital after surgery: Your blood sugar will be checked by the staff and you will probably be given insulin after surgery (instead of oral diabetes medicines) to make sure you have good blood sugar levels. The goal for blood sugar control after surgery is 80-180 mg/dL.   WHAT DO I DO ABOUT MY DIABETES MEDICATION?  Do not take oral diabetes medicines (pills)  the morning of surgery.  THE NIGHT BEFORE If your CBG is greater than 220 mg/dL, you may take  of your sliding scale  (correction) dose of insulin.     Patient Signature:  Date:   Nurse Signature:  Date:   Reviewed and Endorsed by The [facility] Patient Education Committee, August 2015 WHAT IS A BLOOD TRANSFUSION? Blood Transfusion Information  A transfusion is the replacement of blood or some of its parts. Blood is made up of multiple cells which provide different functions. Red blood cells carry oxygen and are used for blood loss replacement. White blood cells fight against infection. Platelets control bleeding. Plasma helps clot blood. Other blood products are available for specialized needs, such as hemophilia or other clotting disorders. BEFORE THE TRANSFUSION  Who gives blood for transfusions?  Healthy volunteers who are fully evaluated to make sure their blood is safe. This is blood bank blood. Transfusion therapy is the safest it has ever been in the practice of medicine. Before blood is taken from a donor, a complete history is taken to make sure that person has no history of diseases nor engages in risky social behavior (examples are intravenous drug use or sexual activity with multiple partners). The donor's travel history is screened to minimize risk of transmitting infections, such as malaria. The donated blood is tested for signs of infectious diseases, such as HIV and hepatitis. The blood is then tested to be sure it is compatible with you in order to minimize the chance of a transfusion reaction. If you or a relative donates blood, this is often done in anticipation of surgery and is not appropriate for emergency situations. It takes many days to process the donated blood. RISKS AND COMPLICATIONS Although transfusion therapy is very safe and saves many lives, the main dangers of transfusion include:  Getting an infectious disease. Developing a transfusion reaction. This is an  allergic reaction to something in the blood you were given. Every precaution is taken to prevent this. The decision to have a blood transfusion has been considered carefully by your caregiver before blood is given. Blood is not given unless the benefits outweigh the risks. AFTER THE TRANSFUSION Right after receiving a blood transfusion, you will usually feel much better and more energetic. This is especially true if your red blood cells have gotten low (anemic). The transfusion raises the level of the red blood cells which carry oxygen, and this usually causes an energy increase. The nurse administering the transfusion will monitor you carefully for complications. HOME CARE INSTRUCTIONS  No special instructions are needed after a  transfusion. You may find your energy is better. Speak with your caregiver about any limitations on activity for underlying diseases you may have. SEEK MEDICAL CARE IF:  Your condition is not improving after your transfusion. You develop redness or irritation at the intravenous (IV) site. SEEK IMMEDIATE MEDICAL CARE IF:  Any of the following symptoms occur over the next 12 hours: Shaking chills. You have a temperature by mouth above 102 F (38.9 C), not controlled by medicine. Chest, back, or muscle pain. People around you feel you are not acting correctly or are confused. Shortness of breath or difficulty breathing. Dizziness and fainting. You get a rash or develop hives. You have a decrease in urine output. Your urine turns a dark color or changes to pink, red, or brown. Any of the following symptoms occur over the next 10 days: You have a temperature by mouth above 102 F (38.9 C), not controlled by medicine. Shortness of breath. Weakness after normal activity. The white part of the eye turns yellow (jaundice). You have a decrease in the amount of urine or are urinating less often. Your urine turns a dark color or changes to pink, red, or brown. Document  Released: 01/18/2000 Document Revised: 04/14/2011 Document Reviewed: 09/06/2007 [facility] Patient Information 2014 [place], [place].

## 2022-07-30 ENCOUNTER — Encounter (HOSPITAL_COMMUNITY): Payer: Self-pay

## 2022-07-30 ENCOUNTER — Other Ambulatory Visit: Payer: Self-pay

## 2022-07-30 ENCOUNTER — Encounter (HOSPITAL_COMMUNITY)
Admission: RE | Admit: 2022-07-30 | Discharge: 2022-07-30 | Disposition: A | Discharge status: Home / Self Care | Payer: Medicare Other | Source: Ambulatory Visit | Attending: Orthopedic Surgery | Admitting: Orthopedic Surgery

## 2022-07-30 VITALS — BP 133/100 | HR 77 | Temp 98.0°F | Resp 16 | Ht 67.0 in | Wt 197.0 lb

## 2022-07-30 DIAGNOSIS — G4733 Obstructive sleep apnea (adult) (pediatric): Secondary | ICD-10-CM | POA: Diagnosis not present

## 2022-07-30 DIAGNOSIS — Z950 Presence of cardiac pacemaker: Secondary | ICD-10-CM | POA: Diagnosis not present

## 2022-07-30 DIAGNOSIS — E119 Type 2 diabetes mellitus without complications: Secondary | ICD-10-CM | POA: Insufficient documentation

## 2022-07-30 DIAGNOSIS — M1612 Unilateral primary osteoarthritis, left hip: Secondary | ICD-10-CM | POA: Insufficient documentation

## 2022-07-30 DIAGNOSIS — Z01812 Encounter for preprocedural laboratory examination: Secondary | ICD-10-CM | POA: Diagnosis present

## 2022-07-30 DIAGNOSIS — I10 Essential (primary) hypertension: Secondary | ICD-10-CM | POA: Diagnosis not present

## 2022-07-30 DIAGNOSIS — Z01818 Encounter for other preprocedural examination: Secondary | ICD-10-CM

## 2022-07-30 DIAGNOSIS — Z7984 Long term (current) use of oral hypoglycemic drugs: Secondary | ICD-10-CM | POA: Insufficient documentation

## 2022-07-30 HISTORY — DX: Presence of cardiac pacemaker: Z95.0

## 2022-07-30 LAB — CBC
HCT: 45.1 % (ref 39.0–52.0)
Hemoglobin: 15.6 g/dL (ref 13.0–17.0)
MCH: 32.8 pg (ref 26.0–34.0)
MCHC: 34.6 g/dL (ref 30.0–36.0)
MCV: 94.9 fL (ref 80.0–100.0)
Platelets: 142 10*3/uL — ABNORMAL LOW (ref 150–400)
RBC: 4.75 MIL/uL (ref 4.22–5.81)
RDW: 12.4 % (ref 11.5–15.5)
WBC: 8.5 10*3/uL (ref 4.0–10.5)
nRBC: 0 % (ref 0.0–0.2)

## 2022-07-30 LAB — BASIC METABOLIC PANEL
Anion gap: 7 (ref 5–15)
BUN: 18 mg/dL (ref 8–23)
CO2: 23 mmol/L (ref 22–32)
Calcium: 8.9 mg/dL (ref 8.9–10.3)
Chloride: 108 mmol/L (ref 98–111)
Creatinine, Ser: 0.87 mg/dL (ref 0.61–1.24)
GFR, Estimated: >60 mL/min (ref >60)
Glucose, Bld: 117 mg/dL — ABNORMAL HIGH (ref 70–99)
Potassium: 3.8 mmol/L (ref 3.5–5.1)
Sodium: 138 mmol/L (ref 135–145)

## 2022-07-30 LAB — SURGICAL PCR SCREEN
MRSA, PCR: NEGATIVE
Staphylococcus aureus: NEGATIVE

## 2022-07-30 LAB — TYPE AND SCREEN

## 2022-07-30 LAB — GLUCOSE, CAPILLARY: Glucose-Capillary: 111 mg/dL — ABNORMAL HIGH (ref 70–99)

## 2022-07-31 ENCOUNTER — Encounter (HOSPITAL_COMMUNITY): Payer: Self-pay

## 2022-07-31 NOTE — Progress Notes (Signed)
Case: [id number] Date/Time: 08/04/22 1225   Procedure: TOTAL HIP ARTHROPLASTY ANTERIOR APPROACH (Left: Hip)   Anesthesia type: Choice   Pre-op diagnosis: left hip osteoarthritis   Location: [facility] ROOM 10 / [facility]   Surgeons: Ollen Gross, MD       DISCUSSION: Jose Cervantes is an 89 yo male who presents to PAT prior to surgery listed above. 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  No prior anesthesia complications.  Follows with his PCP for chronic medical issues. Was last seen in office on 07/29/22. HgA1c was 6.6 on 06/03/22. BP elevated to 133/100 at PAT but appears controlled at PCP and Cardiology OV. Pt cleared at PCP visit:   "Pre-op evaluation Arthritis of left hip Discussed risks/benefits of procedure with patient Discussed that due to age of 89 years old, I would assess him as moderate risk for anesthesia and post-surgical complications. His chronic conditions are well-controlled/optimized. Discussed to monitor progress closely post-surgery and seek immediate medical care if concerns arise"  Patient follows with Cardiology for hx of heart block s/p PPM. Last seen for OV on 06/03/22. Pacemaker noted to be functioning well. Advised he can follow up yearly. Was cleared as low risk on 4/30 (paper copy of clearance is in chart).  Device orders faxed to Cardiologist  VS: BP (!) 133/100   Pulse 77   Temp 36.7 C   Resp 16   Ht 5\' 7"  (1.702 m)   Wt 89.4 kg   SpO2 98%   BMI 30.85 kg/m   PROVIDERS: PCP: Susann Givens, PA-C Cardiology: Sharlene Dory, MD  LABS: Labs reviewed: Acceptable for surgery. (all labs ordered are listed, but only abnormal results are displayed)  Labs Reviewed  GLUCOSE, CAPILLARY - Abnormal; Notable for the following components:      Result Value   Glucose-Capillary 111 (*)    All other components within normal limits   BASIC METABOLIC PANEL - Abnormal; Notable for the following components:   Glucose, Bld 117 (*)    All other components within normal limits  CBC - Abnormal; Notable for the following components:   Platelets 142 (*)    All other components within normal limits  SURGICAL PCR SCREEN  TYPE AND SCREEN     IMAGES:  CXR 06/03/22:  FINDINGS: No evidence of acute infiltrate, pleural fluid, or pneumothorax. Dual-lead cardiac pacemaker. Mediastinal and hilar contours are within normal limits. No acute osseous abnormality.    IMPRESSION:  1. No evidence of acute intrathoracic disease.    EKG 10/17/21:  Diagnosis Sinus rhythm with 1st degree AV block  Right bundle branch block  Abnormal ECG  No previous ECGs available    CV:  Device check 06/26/22:  Impression  Episodes: 13 fast A&V - EGMs show ST, fastest at 162bpm ; Histograms: good . F/U- 20mo remote Normal device function, sinus tachycardia noted  30 day Holter monitor 09/03/21:  Impression  1.  Normal sinus rhythm predominates recording 2.  Sinus tachycardia is noted during daytime activity 3.  Sinus bradycardia is noted during evening hours 4.  Episodes of 2-1 AV block associated with pauses are noted 5.  Maximum pause length is 3 seconds. 6.  Some pauses occurred during sleep, other pauses occurred during daytime hours 7.  Brief nonsustained ventricular tachycardia is noted with occasional PVCs  Echo 07/31/21:  Impression  Adequate 2D M-mode and color flow Doppler  echocardiogram demonstrates normal left ventricular chamber size and contractility.  Wall motion and thickness is normal.  Ejection fraction is normal 2.  The aortic valve is slightly sclerotic but opens adequately 3.  The mitral valve and tricuspid valves are normal.  Trace mitral and tricuspid insufficiency is noted 4.  The atria are of normal size bilaterally 5.  Right ventricular structure and function is normal 6.  The pericardium is of normal  thickness there is no effusion  NM Stress Test 07/30/21:  CONCLUSIONS:  1. This myocardial perfusion imaging study shows a nonreversible inferior wall defect with normal wall motion consistent with diaphragmatic attenuation. There is no evidence for coronary artery disease.  2. 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   Depression    Diabetes mellitus without complication (HCC)    Presence of permanent cardiac pacemaker    Vision decreased    right eye since birth    Past Surgical History:  Procedure Laterality Date   APPENDECTOMY     BACK SURGERY  [phone], 1984   lumbar laminectomy    CHOLECYSTECTOMY     COLONOSCOPY     EYE SURGERY Left    cataract surgery with lens implant   KNEE ARTHROPLASTY Bilateral    LUMBAR LAMINECTOMY/DECOMPRESSION MICRODISCECTOMY N/A 05/21/2017   Procedure: LAMINECTOMY AND FORAMINOTOMY LUMBAR THREE- LUMBAR FOUR, LUMBAR FOUR- LUMBAR FIVE, LUMBAR FIVE- SACRAL ONE;  Surgeon: Tressie Stalker, MD;  Location: [facility] OR;  Service: Neurosurgery;  Laterality: N/A;   MOHS SURGERY Left    ear   PACEMAKER INSERTION     Sept. 2023   TONSILLECTOMY     TRANSURETHRAL RESECTION OF PROSTATE      MEDICATIONS:  acetaminophen (TYLENOL) 500 MG tablet   atorvastatin (LIPITOR) 10 MG tablet   diphenhydrAMINE (BENADRYL) 25 MG tablet   meloxicam (MOBIC) 15 MG tablet   metFORMIN (GLUCOPHAGE) 500 MG tablet   Multiple Vitamin (MULTIVITAMIN WITH MINERALS) TABS tablet   Polyethyl Glycol-Propyl Glycol (SYSTANE) 0.4-0.3 % SOLN   Probiotic Product (PROBIOTIC PO)   traMADol (ULTRAM) 50 MG tablet   No current facility-administered medications for this encounter.   Marcille Blanco [facility] Surgical Short Stay/Anesthesiology [facility] Phone [phone] 07/31/2022 8:48 AM

## 2022-07-31 NOTE — H&P (Signed)
TOTAL HIP ADMISSION H&P  Patient is admitted for left total hip arthroplasty.  Subjective:  Chief Complaint: Left hip pain  HPI: Jose Cervantes, 89 y.o. male, has a history of pain and functional disability in the left hip due to arthritis and patient has failed non-surgical conservative treatments for greater than 12 weeks to include NSAID's and/or analgesics, use of assistive devices, and activity modification. Onset of symptoms was abrupt, starting a few months ago with rapidlly worsening course since that time. The patient noted no past surgery on the left hip. Patient currently rates pain in the left hip at 8 out of 10 with activity. Patient has night pain, worsening of pain with activity and weight bearing, pain that interfers with activities of daily living, and pain with passive range of motion. Patient has evidence of  rapidly progressive arthritis with bone-on-bone formation  by imaging studies. This condition presents safety issues increasing the risk of falls. There is no current active infection.  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   Depression    Diabetes mellitus without complication (HCC)    Presence of permanent cardiac pacemaker    Vision decreased    right eye since birth    Past Surgical History:  Procedure Laterality Date   APPENDECTOMY     BACK SURGERY  [phone], 1984   lumbar laminectomy    CHOLECYSTECTOMY     COLONOSCOPY     EYE SURGERY Left    cataract surgery with lens implant   KNEE ARTHROPLASTY Bilateral    LUMBAR LAMINECTOMY/DECOMPRESSION MICRODISCECTOMY N/A 05/21/2017   Procedure: LAMINECTOMY AND FORAMINOTOMY LUMBAR THREE- LUMBAR FOUR, LUMBAR FOUR- LUMBAR FIVE, LUMBAR FIVE- SACRAL ONE;  Surgeon: Tressie Stalker, MD;  Location: [facility] OR;  Service: Neurosurgery;   Laterality: N/A;   MOHS SURGERY Left    ear   PACEMAKER INSERTION     Sept. 2023   TONSILLECTOMY     TRANSURETHRAL RESECTION OF PROSTATE      Prior to Admission medications   Medication Sig Start Date End Date Taking? Authorizing Provider  acetaminophen (TYLENOL) 500 MG tablet Take 1,000 mg by mouth every 6 (six) hours as needed for moderate pain.   Yes [provider]  atorvastatin (LIPITOR) 10 MG tablet Take 10 mg by mouth daily.   Yes [provider]  diphenhydrAMINE (BENADRYL) 25 MG tablet Take 25 mg by mouth at bedtime.   Yes [provider]  meloxicam (MOBIC) 15 MG tablet Take 15 mg by mouth daily.   Yes [provider]  metFORMIN (GLUCOPHAGE) 500 MG tablet Take 500 mg by mouth daily.   Yes [provider]  Multiple Vitamin (MULTIVITAMIN WITH MINERALS) TABS tablet Take 1 tablet by mouth 2 (two) times a week.   Yes [provider]  Polyethyl Glycol-Propyl Glycol (SYSTANE) 0.4-0.3 % SOLN Place 1 drop into both eyes 3 (three) times daily as needed (dry eyes).   Yes [provider]  Probiotic Product (PROBIOTIC PO) Take 1 capsule by mouth daily as needed (upset stomach).   Yes [provider]  traMADol (ULTRAM) 50 MG tablet Take 50 mg by mouth daily as needed for moderate pain.   Yes [provider]    No Known Allergies  Social History   Socioeconomic  History   Marital status: Married    Spouse name: Not on file   Number of children: Not on file   Years of education: Not on file   Highest education level: Not on file  Occupational History   Not on file  Tobacco Use   Smoking status: Never   Smokeless tobacco: Never  Vaping Use   Vaping Use: Never used  Substance and Sexual Activity   Alcohol use: Yes    Alcohol/week: 2.0 standard drinks of alcohol    Types: 2 Shots of liquor per week    Comment: weekly   Drug use: Never   Sexual activity: Not on file  Other Topics Concern   Not on file   Social History Narrative   Not on file   Social Determinants of Health   Financial Resource Strain: Not on file  Food Insecurity: Not on file  Transportation Needs: Not on file  Physical Activity: Not on file  Stress: Not on file  Social Connections: Not on file  Intimate Partner Violence: Not on file    Tobacco Use: Low Risk  (07/31/2022)   Patient History    Smoking Tobacco Use: Never    Smokeless Tobacco Use: Never    Passive Exposure: Not on file   Social History   Substance and Sexual Activity  Alcohol Use Yes   Alcohol/week: 2.0 standard drinks of alcohol   Types: 2 Shots of liquor per week   Comment: weekly    Family History  Problem Relation Age of Onset   Diabetes Mellitus II Mother    Hypertension Mother    Congestive Heart Failure Father    Heart disease Father    Breast cancer Sister    Bone cancer Sister     Review of Systems  Constitutional:  Negative for chills and fever.  HENT:  Negative for congestion, sore throat and tinnitus.   Eyes:  Negative for double vision, photophobia and pain.  Respiratory:  Negative for cough, shortness of breath and wheezing.   Cardiovascular:  Negative for chest pain, palpitations and orthopnea.  Gastrointestinal:  Negative for heartburn, nausea and vomiting.  Genitourinary:  Negative for dysuria, frequency and urgency.  Musculoskeletal:  Positive for joint pain.  Neurological:  Negative for dizziness, weakness and headaches.     Objective:  Physical Exam: Well nourished and well developed.  General: Alert and oriented x3, cooperative and pleasant, no acute distress.  Head: normocephalic, atraumatic, neck supple.  Eyes: EOMI.  Musculoskeletal:  His left hip can be flexed to 100 and has about 10 of internal rotation, 30 of external rotation, 30 of abduction with discomfort.  - He has a significant antalgic gait pattern on the left with a cane.  Calves soft and nontender. Motor function intact in LE. Strength  5/5 LE bilaterally. Neuro: Distal pulses 2+. Sensation to light touch intact in LE.  Imaging Review Plain radiographs demonstrate severe degenerative joint disease of the left hip. The bone quality appears to be adequate for age and reported activity level.  Assessment/Plan:  End stage arthritis, left hip  The patient history, physical examination, clinical judgement of the provider and imaging studies are consistent with end stage degenerative joint disease of the left hip and total hip arthroplasty is deemed medically necessary. The treatment options including medical management, injection therapy, arthroscopy and arthroplasty were discussed at length. The risks and benefits of total hip arthroplasty were presented and reviewed. The risks due to aseptic loosening, infection, stiffness, dislocation/subluxation, thromboembolic  complications and other imponderables were discussed. The patient acknowledged the explanation, agreed to proceed with the plan and consent was signed. Patient is being admitted for inpatient treatment for surgery, pain control, PT, OT, prophylactic antibiotics, VTE prophylaxis, progressive ambulation and ADLs and discharge planning.The patient is planning to be discharged  home .   Patient's anticipated LOS is less than 2 midnights, meeting these requirements: - Lives within 1 hour of care - Has a competent adult at home to recover with post-op recover - NO history of  - Chronic pain requiring opioids  - Coronary Artery Disease  - Heart failure  - Heart attack  - Stroke  - DVT/VTE  - Cardiac arrhythmia  - Respiratory Failure/COPD  - Renal failure  - Anemia  - Advanced Liver disease  Therapy Plans: HEP Disposition: Home with wife Planned DVT Prophylaxis: Aspirin 81 mg BID DME Needed: None PCP: Nyra Jabs, PA-C (clearance received) Cardiologist: Dyane Dustman, MD (clearance received) TXA: IV Allergies: NKDA Anesthesia Concerns: Hx of multiple lumbar  surgeries BMI: 31.3 Last HgbA1c: 6.6% (05/2022) Pharmacy: [facility] ([address])  - Patient was instructed on what medications to stop prior to surgery. - Follow-up visit in 2 weeks with Dr. Lequita Halt - Begin physical therapy following surgery - Pre-operative lab work as pre-surgical testing - Prescriptions will be provided in hospital at time of discharge  Arther Abbott, PA-C Orthopedic Surgery [facility] [place]

## 2022-07-31 NOTE — Anesthesia Preprocedure Evaluation (Addendum)
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 F/U- 75mo remote Normal device function, sinus tachycardia noted   30 day Holter monitor 09/03/21:   Impression   1.  Normal sinus rhythm predominates recording 2.  Sinus tachycardia is noted during daytime activity 3.  Sinus bradycardia is noted during evening hours 4.  Episodes of 2-1 AV block associated with pauses are noted 5.  Maximum pause length is 3 seconds. 6.  Some pauses occurred during sleep, other pauses occurred during daytime hours 7.  Brief nonsustained ventricular tachycardia is noted with occasional PVCs   Echo 07/31/21:   Impression   Adequate 2D M-mode and color flow Doppler echocardiogram demonstrates normal left ventricular chamber size and contractility.  Wall motion and thickness is normal.  Ejection fraction is normal 2.  The aortic valve is slightly sclerotic but opens adequately 3.  The mitral valve and tricuspid valves are normal.  Trace mitral and tricuspid insufficiency is noted 4.  The atria are of normal size bilaterally 5.  Right ventricular structure and function is normal 6.  The pericardium is of normal thickness there is no effusion   NM Stress Test 07/30/21:  CONCLUSIONS:  1. This myocardial perfusion imaging study shows  a nonreversible inferior wall defect with normal wall motion consistent with diaphragmatic attenuation. There is no evidence for coronary artery disease.  2. Normal LV systolic function     Neuro/Psych  PSYCHIATRIC DISORDERS Anxiety Depression    negative neurological ROS     GI/Hepatic negative GI ROS, Neg liver ROS,,,  Endo/Other  diabetes, Well Controlled, Type 2  A1c 6.6  Renal/GU negative Renal ROS  negative genitourinary   Musculoskeletal  (+) Arthritis , Osteoarthritis,  Lumbar surgery 2019 L3-S1 Has also had back surgeries in 1971, 1973, 1984   Abdominal   Peds  Hematology negative hematology ROS (+) Hb 15.6, plt 142   Anesthesia Other Findings   Reproductive/Obstetrics negative OB ROS                             Anesthesia Physical Anesthesia Plan  ASA: 3  Anesthesia Plan: MAC and Spinal   Post-op Pain Management: Tylenol PO (pre-op)*   Induction: Intravenous  PONV Risk Score and Plan: 2 and Treatment may vary due to age or medical condition, Propofol infusion and TIVA  Airway Management Planned: Natural Airway and Simple Face Mask  Additional Equipment: None  Intra-op Plan:   Post-operative Plan:   Informed Consent: I have reviewed the patients History and Physical, chart, labs and discussed the procedure including the risks, benefits and alternatives for the proposed anesthesia with the patient or authorized representative who has indicated his/her understanding and acceptance.     Dental advisory given  Plan Discussed with: CRNA  Anesthesia  Plan Comments: (Pt strong preference for spinal, will attempt (lumbar surgery x 4 in past))        Anesthesia Quick Evaluation

## 2022-08-04 ENCOUNTER — Ambulatory Visit (HOSPITAL_COMMUNITY): Payer: Medicare Other

## 2022-08-04 ENCOUNTER — Other Ambulatory Visit: Payer: Self-pay

## 2022-08-04 ENCOUNTER — Ambulatory Visit (HOSPITAL_BASED_OUTPATIENT_CLINIC_OR_DEPARTMENT_OTHER): Payer: Medicare Other | Admitting: Anesthesiology

## 2022-08-04 ENCOUNTER — Ambulatory Visit (HOSPITAL_COMMUNITY): Payer: Medicare Other | Admitting: Medical

## 2022-08-04 ENCOUNTER — Observation Stay (HOSPITAL_COMMUNITY): Payer: Medicare Other

## 2022-08-04 ENCOUNTER — Observation Stay (HOSPITAL_COMMUNITY)
Admission: RE | Admit: 2022-08-04 | Discharge: 2022-08-05 | Disposition: A | Discharge status: Home / Self Care | Payer: Medicare Other | Source: Ambulatory Visit | Attending: Orthopedic Surgery | Admitting: Orthopedic Surgery

## 2022-08-04 ENCOUNTER — Encounter (HOSPITAL_COMMUNITY)
Admission: RE | Disposition: A | Discharge status: Home / Self Care | Payer: Self-pay | Source: Ambulatory Visit | Attending: Orthopedic Surgery

## 2022-08-04 ENCOUNTER — Encounter (HOSPITAL_COMMUNITY): Payer: Self-pay | Admitting: Orthopedic Surgery

## 2022-08-04 DIAGNOSIS — Z79899 Other long term (current) drug therapy: Secondary | ICD-10-CM | POA: Insufficient documentation

## 2022-08-04 DIAGNOSIS — M1612 Unilateral primary osteoarthritis, left hip: Secondary | ICD-10-CM | POA: Diagnosis not present

## 2022-08-04 DIAGNOSIS — Z950 Presence of cardiac pacemaker: Secondary | ICD-10-CM | POA: Diagnosis not present

## 2022-08-04 DIAGNOSIS — F418 Other specified anxiety disorders: Secondary | ICD-10-CM

## 2022-08-04 DIAGNOSIS — Z85828 Personal history of other malignant neoplasm of skin: Secondary | ICD-10-CM | POA: Diagnosis not present

## 2022-08-04 DIAGNOSIS — E119 Type 2 diabetes mellitus without complications: Secondary | ICD-10-CM | POA: Diagnosis not present

## 2022-08-04 HISTORY — PX: TOTAL HIP ARTHROPLASTY: SHX124

## 2022-08-04 LAB — GLUCOSE, CAPILLARY
Glucose-Capillary: 120 mg/dL — ABNORMAL HIGH (ref 70–99)
Glucose-Capillary: 119 mg/dL — ABNORMAL HIGH (ref 70–99)
Glucose-Capillary: 191 mg/dL — ABNORMAL HIGH (ref 70–99)
Glucose-Capillary: 198 mg/dL — ABNORMAL HIGH (ref 70–99)

## 2022-08-04 LAB — TYPE AND SCREEN
ABO/RH(D): A POS
Antibody Screen: NEGATIVE

## 2022-08-04 LAB — ABO/RH: ABO/RH(D): A POS

## 2022-08-04 SURGERY — ARTHROPLASTY, HIP, TOTAL, ANTERIOR APPROACH
Anesthesia: Monitor Anesthesia Care | Site: Hip | Laterality: Left

## 2022-08-04 MED ORDER — LACTATED RINGERS IV SOLN: INTRAVENOUS | Status: DC

## 2022-08-04 MED ORDER — 0.9 % SODIUM CHLORIDE (POUR BTL) OPTIME
TOPICAL | Status: DC | PRN
  Administered 2022-08-04: 1000 mL

## 2022-08-04 MED ORDER — HYDROCODONE-ACETAMINOPHEN 7.5-325 MG PO TABS: 1.0000 | ORAL_TABLET | ORAL | Status: DC | PRN

## 2022-08-04 MED ORDER — ONDANSETRON HCL 4 MG/2ML IJ SOLN
INTRAMUSCULAR | Status: DC | PRN
  Administered 2022-08-04: 4 mg via INTRAVENOUS

## 2022-08-04 MED ORDER — METOCLOPRAMIDE HCL 5 MG/ML IJ SOLN: 5.0000 mg | Freq: Three times a day (TID) | INTRAMUSCULAR | Status: DC | PRN

## 2022-08-04 MED ORDER — PROPOFOL 500 MG/50ML IV EMUL
INTRAVENOUS | Status: DC | PRN
  Administered 2022-08-04: 50 ug/kg/min via INTRAVENOUS

## 2022-08-04 MED ORDER — ORAL CARE MOUTH RINSE: 15.0000 mL | Freq: Once | OROMUCOSAL | Status: AC

## 2022-08-04 MED ORDER — BISACODYL 10 MG RE SUPP: 10.0000 mg | Freq: Every day | RECTAL | Status: DC | PRN

## 2022-08-04 MED ORDER — INSULIN ASPART 100 UNIT/ML IJ SOLN: 0.0000 [IU] | Freq: Every day | INTRAMUSCULAR | Status: DC

## 2022-08-04 MED ORDER — HYDROMORPHONE HCL 1 MG/ML IJ SOLN: 0.2500 mg | INTRAMUSCULAR | Status: DC | PRN

## 2022-08-04 MED ORDER — PHENOL 1.4 % MT LIQD: 1.0000 | OROMUCOSAL | Status: DC | PRN

## 2022-08-04 MED ORDER — TRANEXAMIC ACID-NACL 1000-0.7 MG/100ML-% IV SOLN
1000.0000 mg | INTRAVENOUS | Status: AC
  Administered 2022-08-04: 1000 mg via INTRAVENOUS
  Filled 2022-08-04: qty 100

## 2022-08-04 MED ORDER — INSULIN ASPART 100 UNIT/ML IJ SOLN
0.0000 [IU] | Freq: Three times a day (TID) | INTRAMUSCULAR | Status: DC
  Administered 2022-08-04 – 2022-08-05 (×2): 3 [IU] via SUBCUTANEOUS

## 2022-08-04 MED ORDER — MORPHINE SULFATE (PF) 2 MG/ML IV SOLN: 0.5000 mg | INTRAVENOUS | Status: DC | PRN

## 2022-08-04 MED ORDER — DOCUSATE SODIUM 100 MG PO CAPS
100.0000 mg | ORAL_CAPSULE | Freq: Two times a day (BID) | ORAL | Status: DC
  Administered 2022-08-04 – 2022-08-05 (×2): 100 mg via ORAL
  Filled 2022-08-04 (×2): qty 1

## 2022-08-04 MED ORDER — CEFAZOLIN SODIUM-DEXTROSE 2-4 GM/100ML-% IV SOLN
2.0000 g | Freq: Four times a day (QID) | INTRAVENOUS | Status: AC
  Administered 2022-08-04 – 2022-08-05 (×2): 2 g via INTRAVENOUS
  Filled 2022-08-04 (×2): qty 100

## 2022-08-04 MED ORDER — ACETAMINOPHEN 10 MG/ML IV SOLN
1000.0000 mg | Freq: Four times a day (QID) | INTRAVENOUS | Status: DC
  Administered 2022-08-04: 1000 mg via INTRAVENOUS
  Filled 2022-08-04: qty 100

## 2022-08-04 MED ORDER — FENTANYL CITRATE (PF) 100 MCG/2ML IJ SOLN
INTRAMUSCULAR | Status: DC | PRN
  Administered 2022-08-04: 75 ug via INTRAVENOUS
  Administered 2022-08-04: 25 ug via INTRAVENOUS

## 2022-08-04 MED ORDER — BUPIVACAINE LIPOSOME 1.3 % IJ SUSP
INTRAMUSCULAR | Status: AC
  Filled 2022-08-04: qty 20

## 2022-08-04 MED ORDER — DEXAMETHASONE SODIUM PHOSPHATE 10 MG/ML IJ SOLN
INTRAMUSCULAR | Status: DC | PRN
  Administered 2022-08-04: 10 mg via INTRAVENOUS

## 2022-08-04 MED ORDER — WATER FOR IRRIGATION, STERILE IR SOLN
Status: DC | PRN
  Administered 2022-08-04: 2000 mL

## 2022-08-04 MED ORDER — METHOCARBAMOL 500 MG IVPB - SIMPLE MED: 500.0000 mg | Freq: Four times a day (QID) | INTRAVENOUS | Status: DC | PRN

## 2022-08-04 MED ORDER — HYDROCODONE-ACETAMINOPHEN 5-325 MG PO TABS
1.0000 | ORAL_TABLET | ORAL | Status: DC | PRN
  Administered 2022-08-04 – 2022-08-05 (×2): 2 via ORAL
  Filled 2022-08-04 (×2): qty 2

## 2022-08-04 MED ORDER — CHLORHEXIDINE GLUCONATE 0.12 % MT SOLN
15.0000 mL | Freq: Once | OROMUCOSAL | Status: AC
  Administered 2022-08-04: 15 mL via OROMUCOSAL

## 2022-08-04 MED ORDER — BUPIVACAINE IN DEXTROSE 0.75-8.25 % IT SOLN
INTRATHECAL | Status: DC | PRN
  Administered 2022-08-04: 1.8 mL via INTRATHECAL

## 2022-08-04 MED ORDER — ONDANSETRON HCL 4 MG/2ML IJ SOLN: 4.0000 mg | Freq: Four times a day (QID) | INTRAMUSCULAR | Status: DC | PRN

## 2022-08-04 MED ORDER — CEFAZOLIN SODIUM-DEXTROSE 2-4 GM/100ML-% IV SOLN
2.0000 g | INTRAVENOUS | Status: AC
  Administered 2022-08-04: 2 g via INTRAVENOUS
  Filled 2022-08-04: qty 100

## 2022-08-04 MED ORDER — MAGNESIUM CITRATE PO SOLN: 1.0000 | Freq: Once | ORAL | Status: DC | PRN

## 2022-08-04 MED ORDER — POLYETHYLENE GLYCOL 3350 17 G PO PACK: 17.0000 g | PACK | Freq: Every day | ORAL | Status: DC | PRN

## 2022-08-04 MED ORDER — PROPOFOL 10 MG/ML IV BOLUS
INTRAVENOUS | Status: AC
  Filled 2022-08-04: qty 20

## 2022-08-04 MED ORDER — PHENYLEPHRINE HCL-NACL 20-0.9 MG/250ML-% IV SOLN
INTRAVENOUS | Status: DC | PRN
  Administered 2022-08-04: 50 ug/min via INTRAVENOUS

## 2022-08-04 MED ORDER — SODIUM CHLORIDE 0.9 % IV SOLN: INTRAVENOUS | Status: DC

## 2022-08-04 MED ORDER — PROPOFOL 1000 MG/100ML IV EMUL
INTRAVENOUS | Status: AC
  Filled 2022-08-04: qty 100

## 2022-08-04 MED ORDER — SODIUM CHLORIDE (PF) 0.9 % IJ SOLN
INTRAMUSCULAR | Status: AC
  Filled 2022-08-04: qty 50

## 2022-08-04 MED ORDER — ATORVASTATIN CALCIUM 10 MG PO TABS
10.0000 mg | ORAL_TABLET | Freq: Every day | ORAL | Status: DC
  Administered 2022-08-05: 10 mg via ORAL
  Filled 2022-08-04: qty 1

## 2022-08-04 MED ORDER — OXYCODONE HCL 5 MG/5ML PO SOLN: 5.0000 mg | Freq: Once | ORAL | Status: DC | PRN

## 2022-08-04 MED ORDER — ASPIRIN 81 MG PO CHEW
81.0000 mg | CHEWABLE_TABLET | Freq: Two times a day (BID) | ORAL | Status: DC
  Administered 2022-08-05: 81 mg via ORAL
  Filled 2022-08-04: qty 1

## 2022-08-04 MED ORDER — SODIUM CHLORIDE (PF) 0.9 % IJ SOLN
INTRAMUSCULAR | Status: AC
  Filled 2022-08-04: qty 10

## 2022-08-04 MED ORDER — FENTANYL CITRATE (PF) 100 MCG/2ML IJ SOLN
INTRAMUSCULAR | Status: AC
  Filled 2022-08-04: qty 2

## 2022-08-04 MED ORDER — DEXAMETHASONE SODIUM PHOSPHATE 10 MG/ML IJ SOLN
INTRAMUSCULAR | Status: AC
  Filled 2022-08-04: qty 1

## 2022-08-04 MED ORDER — OXYCODONE HCL 5 MG PO TABS: 5.0000 mg | ORAL_TABLET | Freq: Once | ORAL | Status: DC | PRN

## 2022-08-04 MED ORDER — BUPIVACAINE-EPINEPHRINE (PF) 0.25% -1:200000 IJ SOLN
INTRAMUSCULAR | Status: DC | PRN
  Administered 2022-08-04: 50 mL

## 2022-08-04 MED ORDER — TRAMADOL HCL 50 MG PO TABS
50.0000 mg | ORAL_TABLET | Freq: Four times a day (QID) | ORAL | Status: DC | PRN
  Administered 2022-08-05: 100 mg via ORAL
  Filled 2022-08-04: qty 2

## 2022-08-04 MED ORDER — MENTHOL 3 MG MT LOZG: 1.0000 | LOZENGE | OROMUCOSAL | Status: DC | PRN

## 2022-08-04 MED ORDER — ACETAMINOPHEN 325 MG PO TABS: 325.0000 mg | ORAL_TABLET | Freq: Four times a day (QID) | ORAL | Status: DC | PRN

## 2022-08-04 MED ORDER — PROPOFOL 10 MG/ML IV BOLUS
INTRAVENOUS | Status: DC | PRN
  Administered 2022-08-04 (×2): 20 mg via INTRAVENOUS

## 2022-08-04 MED ORDER — ONDANSETRON HCL 4 MG PO TABS: 4.0000 mg | ORAL_TABLET | Freq: Four times a day (QID) | ORAL | Status: DC | PRN

## 2022-08-04 MED ORDER — POVIDONE-IODINE 10 % EX SWAB: 2.0000 | Freq: Once | CUTANEOUS | Status: AC

## 2022-08-04 MED ORDER — METOCLOPRAMIDE HCL 5 MG PO TABS: 5.0000 mg | ORAL_TABLET | Freq: Three times a day (TID) | ORAL | Status: DC | PRN

## 2022-08-04 MED ORDER — DEXAMETHASONE SODIUM PHOSPHATE 10 MG/ML IJ SOLN: 8.0000 mg | Freq: Once | INTRAMUSCULAR | Status: AC

## 2022-08-04 MED ORDER — ONDANSETRON HCL 4 MG/2ML IJ SOLN: 4.0000 mg | Freq: Once | INTRAMUSCULAR | Status: DC | PRN

## 2022-08-04 MED ORDER — ONDANSETRON HCL 4 MG/2ML IJ SOLN
INTRAMUSCULAR | Status: AC
  Filled 2022-08-04: qty 2

## 2022-08-04 MED ORDER — EPINEPHRINE PF 1 MG/ML IJ SOLN
INTRAMUSCULAR | Status: AC
  Filled 2022-08-04: qty 1

## 2022-08-04 MED ORDER — BUPIVACAINE HCL 0.25 % IJ SOLN
INTRAMUSCULAR | Status: AC
  Filled 2022-08-04: qty 1

## 2022-08-04 MED ORDER — METHOCARBAMOL 500 MG PO TABS
500.0000 mg | ORAL_TABLET | Freq: Four times a day (QID) | ORAL | Status: DC | PRN
  Administered 2022-08-04 – 2022-08-05 (×2): 500 mg via ORAL
  Filled 2022-08-04 (×2): qty 1

## 2022-08-04 SURGICAL SUPPLY — 43 items
ADH SKN CLS APL DERMABOND .7 (GAUZE/BANDAGES/DRESSINGS) ×1
BAG COUNTER SPONGE SURGICOUNT (BAG) IMPLANT
BAG SPEC THK2 15X12 ZIP CLS (MISCELLANEOUS)
BAG SPNG CNTER NS LX DISP (BAG)
BAG ZIPLOCK 12X15 (MISCELLANEOUS) IMPLANT
BLADE SAG 18X100X1.27 (BLADE) ×1 IMPLANT
COVER PERINEAL POST (MISCELLANEOUS) ×1 IMPLANT
COVER SURGICAL LIGHT HANDLE (MISCELLANEOUS) ×1 IMPLANT
CUP ACETBLR 54 OD PINNACLE (Hips) IMPLANT
DERMABOND ADVANCED .7 DNX12 (GAUZE/BANDAGES/DRESSINGS) ×1 IMPLANT
DRAPE FOOT SWITCH (DRAPES) ×1 IMPLANT
DRAPE STERI IOBAN 125X83 (DRAPES) ×1 IMPLANT
DRAPE U-SHAPE 47X51 STRL (DRAPES) ×2 IMPLANT
DRSG AQUACEL AG ADV 3.5X 6 (GAUZE/BANDAGES/DRESSINGS) IMPLANT
DRSG AQUACEL AG ADV 3.5X10 (GAUZE/BANDAGES/DRESSINGS) ×1 IMPLANT
DURAPREP 26ML APPLICATOR (WOUND CARE) ×1 IMPLANT
ELECT REM PT RETURN 15FT ADLT (MISCELLANEOUS) ×1 IMPLANT
GLOVE BIO SURGEON STRL SZ 6.5 (GLOVE) IMPLANT
GLOVE BIO SURGEON STRL SZ8 (GLOVE) ×1 IMPLANT
GLOVE BIOGEL PI IND STRL 6.5 (GLOVE) IMPLANT
GLOVE BIOGEL PI IND STRL 7.0 (GLOVE) IMPLANT
GLOVE BIOGEL PI IND STRL 8 (GLOVE) ×1 IMPLANT
GOWN STRL REUS W/ TWL LRG LVL3 (GOWN DISPOSABLE) ×1 IMPLANT
GOWN STRL REUS W/TWL LRG LVL3 (GOWN DISPOSABLE) ×1
HEAD M SROM 36MM PLUS 1.5 (Hips) IMPLANT
HOLDER FOLEY CATH W/STRAP (MISCELLANEOUS) ×1 IMPLANT
KIT TURNOVER KIT A (KITS) IMPLANT
LINER MARATHON NEUT +4X54X36 (Hips) IMPLANT
MANIFOLD NEPTUNE II (INSTRUMENTS) ×1 IMPLANT
PACK ANTERIOR HIP CUSTOM (KITS) ×1 IMPLANT
PENCIL SMOKE EVACUATOR COATED (MISCELLANEOUS) ×1 IMPLANT
SPIKE FLUID TRANSFER (MISCELLANEOUS) ×1 IMPLANT
SROM M HEAD 36MM PLUS 1.5 (Hips) ×1 IMPLANT
STEM FEMORAL SZ8 STD ACTIS (Stem) IMPLANT
STRIP CLOSURE SKIN 1/2X4 (GAUZE/BANDAGES/DRESSINGS) IMPLANT
SUT ETHIBOND NAB CT1 #1 30IN (SUTURE) ×1 IMPLANT
SUT MNCRL AB 4-0 PS2 18 (SUTURE) ×1 IMPLANT
SUT STRATAFIX 0 PDS 27 VIOLET (SUTURE) ×1
SUT VIC AB 2-0 CT1 27 (SUTURE) ×2
SUT VIC AB 2-0 CT1 TAPERPNT 27 (SUTURE) ×2 IMPLANT
SUTURE STRATFX 0 PDS 27 VIOLET (SUTURE) ×1 IMPLANT
TRAY FOLEY MTR SLVR 16FR STAT (SET/KITS/TRAYS/PACK) ×1 IMPLANT
TUBE SUCTION HIGH CAP CLEAR NV (SUCTIONS) ×1 IMPLANT

## 2022-08-04 NOTE — Op Note (Signed)
OPERATIVE REPORT- TOTAL HIP ARTHROPLASTY   PREOPERATIVE DIAGNOSIS: Osteoarthritis of the Left hip.   POSTOPERATIVE DIAGNOSIS: Osteoarthritis of the Left  hip.   PROCEDURE: Left total hip arthroplasty, anterior approach.   SURGEON: Ollen Gross, MD   ASSISTANT: Arther Abbott, PA-C  ANESTHESIA:  Spinal  ESTIMATED BLOOD LOSS:-250 mL    DRAINS: None  COMPLICATIONS: None   CONDITION: PACU - hemodynamically stable.   BRIEF CLINICAL NOTE: Jose Cervantes is a 89 y.o. male who has advanced end-  stage arthritis of their Left  hip with progressively worsening pain and  dysfunction.The patient has failed nonoperative management and presents for  total hip arthroplasty.   PROCEDURE IN DETAIL: After successful administration of spinal  anesthetic, the traction boots for the [facility] bed were placed on both  feet and the patient was placed onto the [facility] bed, boots placed into the leg  holders. The Left hip was then isolated from the perineum with plastic  drapes and prepped and draped in the usual sterile fashion. ASIS and  greater trochanter were marked and a oblique incision was made, starting  at about 1 cm lateral and 2 cm distal to the ASIS and coursing towards  the anterior cortex of the femur. The skin was cut with a 10 blade  through subcutaneous tissue to the level of the fascia overlying the  tensor fascia lata muscle. The fascia was then incised in line with the  incision at the junction of the anterior third and posterior 2/3rd. The  muscle was teased off the fascia and then the interval between the TFL  and the rectus was developed. The Hohmann retractor was then placed at  the top of the femoral neck over the capsule. The vessels overlying the  capsule were cauterized and the fat on top of the capsule was removed.  A Hohmann retractor was then placed anterior underneath the rectus  femoris to give exposure to the entire anterior capsule. A T-shaped  capsulotomy  was performed. The edges were tagged and the femoral head  was identified.       Osteophytes are removed off the superior acetabulum.  The femoral neck was then cut in situ with an oscillating saw. Traction  was then applied to the left lower extremity utilizing the [facility]  traction. The femoral head was then removed. Retractors were placed  around the acetabulum and then circumferential removal of the labrum was  performed. Osteophytes were also removed. Reaming starts at 49 mm to  medialize and  Increased in 2 mm increments to 53 mm. We reamed in  approximately 40 degrees of abduction, 20 degrees anteversion. A 54 mm  pinnacle acetabular shell was then impacted in anatomic position under  fluoroscopic guidance with excellent purchase. We did not need to place  any additional dome screws. A 36 mm neutral + 4 marathon liner was then  placed into the acetabular shell.       The femoral lift was then placed along the lateral aspect of the femur  just distal to the vastus ridge. The leg was  externally rotated and capsule  was stripped off the inferior aspect of the femoral neck down to the  level of the lesser trochanter, this was done with electrocautery. The femur was lifted after this was performed. The  leg was then placed in an extended and adducted position essentially delivering the femur. We also removed the capsule superiorly and the piriformis from the piriformis fossa to  gain excellent exposure of the  proximal femur. Rongeur was used to remove some cancellous bone to get  into the lateral portion of the proximal femur for placement of the  initial starter reamer. The starter broaches was placed  the starter broach  and was shown to go down the center of the canal. Broaching  with the Actis system was then performed starting at size 0  coursing  Up to size 8. A size 8 had excellent torsional and rotational  and axial stability. The trial standard offset neck was then placed  with a 36  + 1.5 trial head. The hip was then reduced. We confirmed that  the stem was in the canal both on AP and lateral x-rays. It also has excellent sizing. The hip was reduced with outstanding stability through full extension and full external rotation.. AP pelvis was taken and the leg lengths were measured and found to be equal. Hip was then dislocated again and the femoral head and neck removed. The  femoral broach was removed. Size 8 Actis stem with a standard offset  neck was then impacted into the femur following native anteversion. Has  excellent purchase in the canal. Excellent torsional and rotational and  axial stability. It is confirmed to be in the canal on AP and lateral  fluoroscopic views. The 36 + 1.5 metal head was placed and the hip  reduced with outstanding stability. Again AP pelvis was taken and it  confirmed that the leg lengths were equal. The wound was then copiously  irrigated with saline solution and the capsule reattached and repaired  with Ethibond suture. 30 ml of .25% Bupivicaine was  injected into the capsule and into the edge of the tensor fascia lata as well as subcutaneous tissue. The fascia overlying the tensor fascia lata was then closed with a running #1 V-Loc. Subcu was closed with interrupted 2-0 Vicryl and subcuticular running 4-0 Monocryl. Incision was cleaned  and dried. Steri-Strips and a bulky sterile dressing applied. The patient was awakened and transported to  recovery in stable condition.        Please note that a surgical assistant was a medical necessity for this procedure to perform it in a safe and expeditious manner. Assistant was necessary to provide appropriate retraction of vital neurovascular structures and to prevent femoral fracture and allow for anatomic placement of the prosthesis.  Ollen Gross, M.D.

## 2022-08-04 NOTE — Transfer of Care (Signed)
Immediate Anesthesia Transfer of Care Note  Patient: Jose Cervantes  Procedure(s) Performed: LEFT TOTAL HIP ARTHROPLASTY ANTERIOR APPROACH (Left: Hip)  Patient Location: PACU  Anesthesia Type:Spinal  Level of Consciousness: awake, alert , and oriented  Airway & Oxygen Therapy: Patient Spontanous Breathing and Patient connected to face mask oxygen  Post-op Assessment: Report given to RN and Post -op Vital signs reviewed and stable  Post vital signs: Reviewed and stable  Last Vitals:  Vitals Value Taken Time  BP 89/64 08/04/22 1447  Temp    Pulse 75 08/04/22 1448  Resp 12 08/04/22 1448  SpO2 95 % 08/04/22 1448  Vitals shown include unvalidated device data.  Last Pain:  Vitals:   08/04/22 1051  PainSc: 0-No pain         Complications: No notable events documented.

## 2022-08-04 NOTE — Anesthesia Postprocedure Evaluation (Signed)
Anesthesia Post Note  Patient: Jose Cervantes  Procedure(s) Performed: LEFT TOTAL HIP ARTHROPLASTY ANTERIOR APPROACH (Left: Hip)     Patient location during evaluation: PACU Anesthesia Type: MAC and Spinal Level of consciousness: awake and alert and oriented Pain management: pain level controlled Vital Signs Assessment: post-procedure vital signs reviewed and stable Respiratory status: spontaneous breathing, nonlabored ventilation and respiratory function stable Cardiovascular status: blood pressure returned to baseline and stable Postop Assessment: no headache, no backache, spinal receding and patient able to bend at knees Anesthetic complications: no   No notable events documented.  Last Vitals:  Vitals:   08/04/22 1530 08/04/22 1545  BP: 116/77 127/73  Pulse: 73 76  Resp: 13 (!) 23  Temp:    SpO2: 98% 96%    Last Pain:  Vitals:   08/04/22 1545  PainSc: 0-No pain                 Tennis Must Merced Hanners

## 2022-08-04 NOTE — Anesthesia Procedure Notes (Signed)
Date/Time: 08/04/2022 1:12 PM  Performed by: Florene Route, CRNAOxygen Delivery Method: Simple face mask

## 2022-08-04 NOTE — Interval H&P Note (Signed)
History and Physical Interval Note:  08/04/2022 10:51 AM  Jose Cervantes  has presented today for surgery, with the diagnosis of left hip osteoarthritis.  The various methods of treatment have been discussed with the patient and family. After consideration of risks, benefits and other options for treatment, the patient has consented to  Procedure(s): TOTAL HIP ARTHROPLASTY ANTERIOR APPROACH (Left) as a surgical intervention.  The patient's history has been reviewed, patient examined, no change in status, stable for surgery.  I have reviewed the patient's chart and labs.  Questions were answered to the patient's satisfaction.     Homero Fellers Anaid Haney

## 2022-08-04 NOTE — Discharge Instructions (Signed)
Frank Aluisio, MD Total Joint Specialist [facility] [address] [place], [place] [address] [phone]  ANTERIOR APPROACH TOTAL HIP REPLACEMENT POSTOPERATIVE DIRECTIONS     Hip Rehabilitation, Guidelines Following Surgery  The results of a hip operation are greatly improved after range of motion and muscle strengthening exercises. Follow all safety measures which are given to protect your hip. If any of these exercises cause increased pain or swelling in your joint, decrease the amount until you are comfortable again. Then slowly increase the exercises. Call your caregiver if you have problems or questions.   BLOOD CLOT PREVENTION Take an 81 mg Aspirin two times a day for three weeks following surgery. Then take an 81 mg Aspirin once a day for three weeks. Then discontinue Aspirin. You may resume your vitamins/supplements upon discharge from the hospital. Do not take any NSAIDs (Advil, Aleve, Ibuprofen, Meloxicam, etc.) until you are 3 weeks out from surgery  HOME CARE INSTRUCTIONS  Remove items at home which could result in a fall. This includes throw rugs or furniture in walking pathways.  ICE to the affected hip as frequently as 20-30 minutes an hour and then as needed for pain and swelling. Continue to use ice on the hip for pain and swelling from surgery. You may notice swelling that will progress down to the foot and ankle. This is normal after surgery. Elevate the leg when you are not up walking on it.   Continue to use the breathing machine which will help keep your temperature down.  It is common for your temperature to cycle up and down following surgery, especially at night when you are not up moving around and exerting yourself.  The breathing machine keeps your lungs expanded and your temperature down.  DIET You may resume your previous home diet once your are discharged from the hospital.  DRESSING / WOUND CARE / SHOWERING You have an  adhesive waterproof bandage over the incision. Leave this in place until your first follow-up appointment. Once you remove this you will not need to place another bandage.  You may begin showering 3 days following surgery, but do not submerge the incision under water.  ACTIVITY For the first 3-5 days, it is important to rest and keep the operative leg elevated. You should, as a general rule, rest for 50 minutes and walk/stretch for 10 minutes per hour. After 5 days, you may slowly increase activity as tolerated.  Perform the exercises you were provided twice a day for about 15-20 minutes each session. Begin these 2 days following surgery. Walk with your walker as instructed. Use the walker until you are comfortable transitioning to a cane. Walk with the cane in the opposite hand of the operative leg. You may discontinue the cane once you are comfortable and walking steadily. Avoid periods of inactivity such as sitting longer than an hour when not asleep. This helps prevent blood clots.  Do not drive a car for 6 weeks or until released by your surgeon.  Do not drive while taking narcotics.  TED HOSE STOCKINGS Wear the elastic stockings on both legs for three weeks following surgery during the day. You may remove them at night while sleeping.  WEIGHT BEARING Weight bearing as tolerated with assist device (walker, cane, etc) as directed, use it as long as suggested by your surgeon or therapist, typically at least 4-6 weeks.  POSTOPERATIVE CONSTIPATION PROTOCOL Constipation - defined medically as fewer than three stools per week and severe constipation as   less than one stool per week.  One of the most common issues patients have following surgery is constipation.  Even if you have a regular bowel pattern at home, your normal regimen is likely to be disrupted due to multiple reasons following surgery.  Combination of anesthesia, postoperative narcotics, change in appetite and fluid intake all can  affect your bowels.  In order to avoid complications following surgery, here are some recommendations in order to help you during your recovery period.  Colace (docusate) - Pick up an over-the-counter form of Colace or another stool softener and take twice a day as long as you are requiring postoperative pain medications.  Take with a full glass of water daily.  If you experience loose stools or diarrhea, hold the colace until you stool forms back up.  If your symptoms do not get better within 1 week or if they get worse, check with your doctor. Dulcolax (bisacodyl) - Pick up over-the-counter and take as directed by the product packaging as needed to assist with the movement of your bowels.  Take with a full glass of water.  Use this product as needed if not relieved by Colace only.  MiraLax (polyethylene glycol) - Pick up over-the-counter to have on hand.  MiraLax is a solution that will increase the amount of water in your bowels to assist with bowel movements.  Take as directed and can mix with a glass of water, juice, soda, coffee, or tea.  Take if you go more than two days without a movement.Do not use MiraLax more than once per day. Call your doctor if you are still constipated or irregular after using this medication for 7 days in a row.  If you continue to have problems with postoperative constipation, please contact the office for further assistance and recommendations.  If you experience "the worst abdominal pain ever" or develop nausea or vomiting, please contact the office immediatly for further recommendations for treatment.  ITCHING  If you experience itching with your medications, try taking only a single pain pill, or even half a pain pill at a time.  You can also use Benadryl over the counter for itching or also to help with sleep.   MEDICATIONS See your medication summary on the "After Visit Summary" that the nursing staff will review with you prior to discharge.  You may have some home  medications which will be placed on hold until you complete the course of blood thinner medication.  It is important for you to complete the blood thinner medication as prescribed by your surgeon.  Continue your approved medications as instructed at time of discharge.  PRECAUTIONS If you experience chest pain or shortness of breath - call 911 immediately for transfer to the hospital emergency department.  If you develop a fever greater that 101 F, purulent drainage from wound, increased redness or drainage from wound, foul odor from the wound/dressing, or calf pain - CONTACT YOUR SURGEON.                                                   FOLLOW-UP APPOINTMENTS Make sure you keep all of your appointments after your operation with your surgeon and caregivers. You should call the office at the above phone number and make an appointment for approximately two weeks after the date of your surgery or on the   date instructed by your surgeon outlined in the "After Visit Summary".  RANGE OF MOTION AND STRENGTHENING EXERCISES  These exercises are designed to help you keep full movement of your hip joint. Follow your caregiver's or physical therapist's instructions. Perform all exercises about fifteen times, three times per day or as directed. Exercise both hips, even if you have had only one joint replacement. These exercises can be done on a training (exercise) mat, on the floor, on a table or on a bed. Use whatever works the best and is most comfortable for you. Use music or television while you are exercising so that the exercises are a pleasant break in your day. This will make your life better with the exercises acting as a break in routine you can look forward to.  Lying on your back, slowly slide your foot toward your buttocks, raising your knee up off the floor. Then slowly slide your foot back down until your leg is straight again.  Lying on your back spread your legs as far apart as you can without causing  discomfort.  Lying on your side, raise your upper leg and foot straight up from the floor as far as is comfortable. Slowly lower the leg and repeat.  Lying on your back, tighten up the muscle in the front of your thigh (quadriceps muscles). You can do this by keeping your leg straight and trying to raise your heel off the floor. This helps strengthen the largest muscle supporting your knee.  Lying on your back, tighten up the muscles of your buttocks both with the legs straight and with the knee bent at a comfortable angle while keeping your heel on the floor.   POST-OPERATIVE OPIOID TAPER INSTRUCTIONS: It is important to wean off of your opioid medication as soon as possible. If you do not need pain medication after your surgery it is ok to stop day one. Opioids include: Codeine, Hydrocodone(Norco, Vicodin), Oxycodone(Percocet, oxycontin) and hydromorphone amongst others.  Long term and even short term use of opiods can cause: Increased pain response Dependence Constipation Depression Respiratory depression And more.  Withdrawal symptoms can include Flu like symptoms Nausea, vomiting And more Techniques to manage these symptoms Hydrate well Eat regular healthy meals Stay active Use relaxation techniques(deep breathing, meditating, yoga) Do Not substitute Alcohol to help with tapering If you have been on opioids for less than two weeks and do not have pain than it is ok to stop all together.  Plan to wean off of opioids This plan should start within one week post op of your joint replacement. Maintain the same interval or time between taking each dose and first decrease the dose.  Cut the total daily intake of opioids by one tablet each day Next start to increase the time between doses. The last dose that should be eliminated is the evening dose.   IF YOU ARE TRANSFERRED TO A SKILLED REHAB FACILITY If the patient is transferred to a skilled rehab facility following release from the  hospital, a list of the current medications will be sent to the facility for the patient to continue.  When discharged from the skilled rehab facility, please have the facility set up the patient's Home Health Physical Therapy prior to being released. Also, the skilled facility will be responsible for providing the patient with their medications at time of release from the facility to include their pain medication, the muscle relaxants, and their blood thinner medication. If the patient is still at the rehab facility   at time of the two week follow up appointment, the skilled rehab facility will also need to assist the patient in arranging follow up appointment in our office and any transportation needs.  MAKE SURE YOU:  Understand these instructions.  Get help right away if you are not doing well or get worse.    DENTAL ANTIBIOTICS:  In most cases prophylactic antibiotics for Dental procdeures after total joint surgery are not necessary.  Exceptions are as follows:  1. History of prior total joint infection  2. Severely immunocompromised (Organ Transplant, cancer chemotherapy, Rheumatoid biologic meds such as Humera)  3. Poorly controlled diabetes (A1C &gt; 8.0, blood glucose over 200)  If you have one of these conditions, contact your surgeon for an antibiotic prescription, prior to your dental procedure.    Pick up stool softner and laxative for home use following surgery while on pain medications. Do not submerge incision under water. Please use good hand washing techniques while changing dressing each day. May shower starting three days after surgery. Please use a clean towel to pat the incision dry following showers. Continue to use ice for pain and swelling after surgery. Do not use any lotions or creams on the incision until instructed by your surgeon.  

## 2022-08-04 NOTE — Anesthesia Procedure Notes (Signed)
Spinal  Patient location during procedure: OR Start time: 08/04/2022 1:15 PM End time: 08/04/2022 1:21 PM Reason for block: surgical anesthesia Staffing Performed: resident/CRNA  Resident/CRNA: Florene Route, CRNA Performed by: Florene Route, CRNA Authorized by: Lannie Fields, DO   Preanesthetic Checklist Completed: patient identified, IV checked, site marked, risks and benefits discussed, surgical consent, monitors and equipment checked, pre-op evaluation and timeout performed Spinal Block Patient position: sitting Prep: DuraPrep and site prepped and draped Patient monitoring: heart rate, continuous pulse ox and blood pressure Approach: midline Location: L2-3 Injection technique: single-shot Needle Needle type: Pencan  Needle gauge: 24 G Needle length: 10 cm Assessment Sensory level: T6 Events: CSF return Additional Notes Kit expiration 02/02/2024 and lot #[id number] Clear free flow of CSF, negative heme, negative paresthesia Tolerated well and returned to supine position

## 2022-08-05 ENCOUNTER — Encounter (HOSPITAL_COMMUNITY): Payer: Self-pay | Admitting: Orthopedic Surgery

## 2022-08-05 DIAGNOSIS — M1612 Unilateral primary osteoarthritis, left hip: Secondary | ICD-10-CM | POA: Diagnosis not present

## 2022-08-05 LAB — CBC
HCT: 39.9 % (ref 39.0–52.0)
Hemoglobin: 13.6 g/dL (ref 13.0–17.0)
MCH: 32.1 pg (ref 26.0–34.0)
MCHC: 34.1 g/dL (ref 30.0–36.0)
MCV: 94.1 fL (ref 80.0–100.0)
Platelets: 149 10*3/uL — ABNORMAL LOW (ref 150–400)
RBC: 4.24 MIL/uL (ref 4.22–5.81)
RDW: 12.5 % (ref 11.5–15.5)
WBC: 16.1 10*3/uL — ABNORMAL HIGH (ref 4.0–10.5)
nRBC: 0 % (ref 0.0–0.2)

## 2022-08-05 LAB — BASIC METABOLIC PANEL
Anion gap: 9 (ref 5–15)
BUN: 17 mg/dL (ref 8–23)
CO2: 22 mmol/L (ref 22–32)
Calcium: 8.3 mg/dL — ABNORMAL LOW (ref 8.9–10.3)
Chloride: 103 mmol/L (ref 98–111)
Creatinine, Ser: 0.95 mg/dL (ref 0.61–1.24)
GFR, Estimated: >60 mL/min (ref >60)
Glucose, Bld: 170 mg/dL — ABNORMAL HIGH (ref 70–99)
Potassium: 3.8 mmol/L (ref 3.5–5.1)
Sodium: 134 mmol/L — ABNORMAL LOW (ref 135–145)

## 2022-08-05 LAB — HEMOGLOBIN A1C
Hgb A1c MFr Bld: 6.3 % — ABNORMAL HIGH (ref 4.8–5.6)
Mean Plasma Glucose: 134 mg/dL

## 2022-08-05 LAB — GLUCOSE, CAPILLARY: Glucose-Capillary: 163 mg/dL — ABNORMAL HIGH (ref 70–99)

## 2022-08-05 MED ORDER — METHOCARBAMOL 500 MG PO TABS: 500.0000 mg | ORAL_TABLET | Freq: Four times a day (QID) | ORAL | 0 refills | Status: DC | PRN

## 2022-08-05 MED ORDER — HYDROCODONE-ACETAMINOPHEN 5-325 MG PO TABS: 1.0000 | ORAL_TABLET | Freq: Four times a day (QID) | ORAL | 0 refills | Status: DC | PRN

## 2022-08-05 MED ORDER — ONDANSETRON HCL 4 MG PO TABS: 4.0000 mg | ORAL_TABLET | Freq: Four times a day (QID) | ORAL | 0 refills | Status: DC | PRN

## 2022-08-05 MED ORDER — ASPIRIN 81 MG PO CHEW: 81.0000 mg | CHEWABLE_TABLET | Freq: Two times a day (BID) | ORAL | 0 refills | Status: AC

## 2022-08-05 MED ORDER — TRAMADOL HCL 50 MG PO TABS: 50.0000 mg | ORAL_TABLET | Freq: Four times a day (QID) | ORAL | 0 refills | Status: DC | PRN

## 2022-08-05 NOTE — Progress Notes (Signed)
Physical Therapy Treatment Patient Details Name: Jose Cervantes MRN: [id number] DOB: 12/16/1932 Today's Date: 08/05/2022   History of Present Illness 89 y.o. male admitted 08/04/22 for L THA-AA. PMH: back surgery 2019, pacemaker, DM, decrased vision.    PT Comments  Stair training completed with wife and son present, pt is ready to DC home from a PT standpoint.      Assistance Recommended at Discharge Intermittent Supervision/Assistance  If plan is discharge home, recommend the following:  Can travel by private vehicle    A little help with bathing/dressing/bathroom;Assistance with cooking/housework;Help with stairs or ramp for entrance;Assist for transportation      Equipment Recommendations  None recommended by PT    Recommendations for Other Services       Precautions / Restrictions Precautions Precautions: Fall Restrictions Weight Bearing Restrictions: No     Mobility  Bed Mobility Overal bed mobility: Modified Independent             General bed mobility comments: up in recliner    Transfers Overall transfer level: Needs assistance Equipment used: Rolling walker (2 wheels) Transfers: Sit to/from Stand Sit to Stand: Supervision           General transfer comment: VCs hand placement    Ambulation/Gait Ambulation/Gait assistance: Supervision Gait Distance (Feet): 150 Feet Assistive device: Rolling walker (2 wheels) Gait Pattern/deviations: Step-to pattern, Step-through pattern Gait velocity: decr but WFL     General Gait Details: no loss of balance, good sequencing   Stairs Stairs: Yes Stairs assistance: Supervision Stair Management: One rail Right, With cane, Step to pattern, Forwards Number of Stairs: 5 General stair comments: VCs sequencing, no loss of balance   Wheelchair Mobility     Tilt Bed    Modified Rankin (Stroke Patients Only)       Balance Overall balance assessment: Modified Independent                                           Cognition Arousal/Alertness: Awake/alert Behavior During Therapy: WFL for tasks assessed/performed Overall Cognitive Status: Within Functional Limits for tasks assessed                                          Exercises     General Comments        Pertinent Vitals/Pain Pain Assessment Pain Assessment: 0-10 Pain Score: 7  Pain Location: L hip with movement Pain Descriptors / Indicators: Sore Pain Intervention(s): Limited activity within patient's tolerance, Monitored during session, Patient requesting pain meds-RN notified (pt declined ice)    Home Living Family/patient expects to be discharged to:: Private residence Living Arrangements: Spouse/significant other Available Help at Discharge: Family;Available 24 hours/day   Home Access: Stairs to enter   Entrance Stairs-Number of Steps: 5   Home Layout: Two level;Able to live on main level with bedroom/bathroom Home Equipment: Rolling Walker (2 wheels);Cane - single point;Grab bars - toilet;Shower seat      Prior Function            PT Goals (current goals can now be found in the care plan section) Acute Rehab PT Goals Patient Stated Goal: golf PT Goal Formulation: With patient/family Time For Goal Achievement: 08/19/22 Potential to Achieve Goals: Good Progress towards PT goals: Goals met/education completed, patient discharged  from PT    Frequency    BID      PT Plan      Co-evaluation              AM-PAC PT "6 Clicks" Mobility   Outcome Measure  Help needed turning from your back to your side while in a flat bed without using bedrails?: A Little Help needed moving from lying on your back to sitting on the side of a flat bed without using bedrails?: A Little Help needed moving to and from a bed to a chair (including a wheelchair)?: None Help needed standing up from a chair using your arms (e.g., wheelchair or bedside chair)?: None Help needed to  walk in hospital room?: None Help needed climbing 3-5 steps with a railing? : A Little 6 Click Score: 21    End of Session Equipment Utilized During Treatment: Gait belt Activity Tolerance: Patient tolerated treatment well Patient left: in chair;with chair alarm set;with call bell/phone within reach Nurse Communication: Mobility status PT Visit Diagnosis: Difficulty in walking, not elsewhere classified (R26.2);Pain Pain - Right/Left: Left Pain - part of body: Hip     Time: 1610-9604 PT Time Calculation (min) (ACUTE ONLY): 19 min  Charges:    $Gait Training: 8-22 mins PT General Charges $$ ACUTE PT VISIT: 1 Visit                     Ralene Bathe Kistler PT 08/05/2022  Acute Rehabilitation Services  Office [phone]

## 2022-08-05 NOTE — Progress Notes (Signed)
   Subjective: 1 Day Post-Op Procedure(s) (LRB): LEFT TOTAL HIP ARTHROPLASTY ANTERIOR APPROACH (Left) Patient reports pain as mild.   Patient seen in rounds by Dr. Lequita Halt. Patient is well, and has had no acute complaints or problems. Denies chest pain or SOB. States he is ready to go home today. Foley catheter removed this AM. We will begin therapy today.  Objective: Vital signs in last 24 hours: Temp:  [97.6 F (36.4 C)-98.8 F (37.1 C)] 98.2 F (36.8 C) (07/02 0024) Pulse Rate:  [71-88] 84 (07/02 0024) Resp:  [12-23] 15 (07/02 0024) BP: (89-152)/(62-99) 123/70 (07/02 0024) SpO2:  [93 %-100 %] 93 % (07/02 0024) Weight:  [86.2 kg] 86.2 kg (07/01 1051)  Intake/Output from previous day:  Intake/Output Summary (Last 24 hours) at 08/05/2022 0841 Last data filed at 08/05/2022 0532 Gross per 24 hour  Intake 2682.28 ml  Output 1800 ml  Net 882.28 ml     Intake/Output this shift: No intake/output data recorded.  Labs: Recent Labs    08/05/22 0340  HGB 13.6   Recent Labs    08/05/22 0340  WBC 16.1*  RBC 4.24  HCT 39.9  PLT 149*   Recent Labs    08/05/22 0340  NA 134*  K 3.8  CL 103  CO2 22  BUN 17  CREATININE 0.95  GLUCOSE 170*  CALCIUM 8.3*   No results for input(s): "LABPT", "INR" in the last 72 hours.  Exam: General - Patient is Alert and Oriented Extremity - Neurologically intact Neurovascular intact Sensation intact distally Dorsiflexion/Plantar flexion intact Dressing - dressing C/D/I Motor Function - intact, moving foot and toes well on exam.   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   Weight as of this encounter: 86.2 kg. Advance diet Up with therapy D/C IV fluids  DVT Prophylaxis - Aspirin Weight bearing as tolerated. Begin therapy.  Plan is to go Home after hospital stay. Plan for discharge with HEP later today if progresses with therapy and meeting goals. Follow-up in the office in 2 weeks.  The PDMP database was reviewed today prior to any opioid medications being prescribed to this patient.  Arther Abbott, PA-C Orthopedic Surgery [phone] 08/05/2022, 8:41 AM

## 2022-08-05 NOTE — Plan of Care (Signed)
  Problem: Pain Managment: Goal: General experience of comfort will improve 08/05/2022 0204 by Sofie Rower, RN Outcome: Progressing 08/05/2022 0203 by Sofie Rower, RN Outcome: Progressing   Problem: Activity: Goal: Ability to tolerate increased activity will improve 08/05/2022 0204 by Sofie Rower, RN Outcome: Progressing 08/05/2022 0203 by Sofie Rower, RN Outcome: Progressing

## 2022-08-05 NOTE — Evaluation (Signed)
Physical Therapy Evaluation Patient Details Name: Jose Cervantes MRN: [id number] DOB: January 21, 1933 Today's Date: 08/05/2022  History of Present Illness  89 y.o. male admitted 08/04/22 for L THA-AA. PMH: back surgery 2019, pacemaker, DM, decrased vision.  Clinical Impression  Pt is s/p THA resulting in the deficits listed below (see PT Problem List). Pt ambulated 140' with RW, no loss of balance. He demonstrates good understanding of HEP. Will plan to see pt for second session for stair training, then I expect he'll be ready to DC home from a PT standpoint.  Pt will benefit from acute skilled PT to increase their independence and safety with mobility to facilitate discharge.          Assistance Recommended at Discharge Intermittent Supervision/Assistance  If plan is discharge home, recommend the following:  Can travel by private vehicle  A little help with bathing/dressing/bathroom;Assistance with cooking/housework;Help with stairs or ramp for entrance;Assist for transportation        Equipment Recommendations None recommended by PT  Recommendations for Other Services       Functional Status Assessment Patient has had a recent decline in their functional status and demonstrates the ability to make significant improvements in function in a reasonable and predictable amount of time.     Precautions / Restrictions Precautions Precautions: Fall Restrictions Weight Bearing Restrictions: No      Mobility  Bed Mobility Overal bed mobility: Modified Independent             General bed mobility comments: used belt as LLE lifter, HOB up    Transfers Overall transfer level: Needs assistance Equipment used: Rolling walker (2 wheels) Transfers: Sit to/from Stand Sit to Stand: Min guard, From elevated surface           General transfer comment: VCs hand placement    Ambulation/Gait Ambulation/Gait assistance: Min guard Gait Distance (Feet): 140 Feet Assistive device:  Rolling walker (2 wheels) Gait Pattern/deviations: Step-to pattern, Step-through pattern Gait velocity: decr but WFL     General Gait Details: VCs sequencing, no loss of balance  Stairs            Wheelchair Mobility     Tilt Bed    Modified Rankin (Stroke Patients Only)       Balance Overall balance assessment: Modified Independent                                           Pertinent Vitals/Pain Pain Assessment Pain Assessment: 0-10 Pain Score: 5  Pain Location: L hip with movement Pain Descriptors / Indicators: Sore Pain Intervention(s): Limited activity within patient's tolerance, Monitored during session, Premedicated before session, Ice applied    Home Living Family/patient expects to be discharged to:: Private residence Living Arrangements: Spouse/significant other Available Help at Discharge: Family;Available 24 hours/day   Home Access: Stairs to enter   Entrance Stairs-Number of Steps: 5   Home Layout: Two level;Able to live on main level with bedroom/bathroom Home Equipment: Rolling Walker (2 wheels);Cane - single point;Grab bars - toilet;Shower seat      Prior Function Prior Level of Function : Independent/Modified Independent             Mobility Comments: walked with SPC, denies falls in past 6 months ADLs Comments: independent     Hand Dominance        Extremity/Trunk Assessment   Upper Extremity Assessment Upper Extremity  Assessment: Overall WFL for tasks assessed    Lower Extremity Assessment Lower Extremity Assessment: LLE deficits/detail;RLE deficits/detail RLE Sensation: history of peripheral neuropathy;decreased light touch LLE Deficits / Details: knee ext at least 3/5, hip AAROM WFL LLE Sensation: history of peripheral neuropathy;decreased light touch    Cervical / Trunk Assessment Cervical / Trunk Assessment: Normal  Communication   Communication: No difficulties  Cognition Arousal/Alertness:  Awake/alert Behavior During Therapy: WFL for tasks assessed/performed Overall Cognitive Status: Within Functional Limits for tasks assessed                                          General Comments      Exercises Total Joint Exercises Ankle Circles/Pumps: AROM, Both, 10 reps, Supine Quad Sets: AROM, Both, 5 reps, Supine Short Arc Quad: AROM, Left, 5 reps, Supine Heel Slides: AAROM, Left, 10 reps, Supine Hip ABduction/ADduction: AAROM, Left, 5 reps, Supine Long Arc Quad: AROM, Left, 5 reps, Seated   Assessment/Plan    PT Assessment Patient needs continued PT services  PT Problem List Decreased activity tolerance;Decreased mobility       PT Treatment Interventions Gait training;Therapeutic exercise;Functional mobility training;Therapeutic activities;Patient/family education;DME instruction    PT Goals (Current goals can be found in the Care Plan section)  Acute Rehab PT Goals Patient Stated Goal: golf PT Goal Formulation: With patient Time For Goal Achievement: 08/19/22 Potential to Achieve Goals: Good    Frequency BID     Co-evaluation               AM-PAC PT "6 Clicks" Mobility  Outcome Measure Help needed turning from your back to your side while in a flat bed without using bedrails?: A Little Help needed moving from lying on your back to sitting on the side of a flat bed without using bedrails?: A Little Help needed moving to and from a bed to a chair (including a wheelchair)?: A Little Help needed standing up from a chair using your arms (e.g., wheelchair or bedside chair)?: A Little Help needed to walk in hospital room?: A Little Help needed climbing 3-5 steps with a railing? : A Little 6 Click Score: 18    End of Session Equipment Utilized During Treatment: Gait belt Activity Tolerance: Patient tolerated treatment well Patient left: in chair;with chair alarm set;with call bell/phone within reach Nurse Communication: Mobility status PT  Visit Diagnosis: Difficulty in walking, not elsewhere classified (R26.2);Pain Pain - Right/Left: Left Pain - part of body: Hip    Time: 0913-0940 PT Time Calculation (min) (ACUTE ONLY): 27 min   Charges:   PT Evaluation $PT Eval Moderate Complexity: 1 Mod PT Treatments $Gait Training: 8-22 mins PT General Charges $$ ACUTE PT VISIT: 1 Visit         Tamala Ser PT 08/05/2022  Acute Rehabilitation Services  Office [phone]

## 2022-08-06 NOTE — Discharge Summary (Signed)
Patient ID: Jose Cervantes MRN: [id number] DOB/AGE: [birth year] 89 y.o.  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 male who was admitted 08/04/2022 for operative treatment ofPrimary osteoarthritis of left hip. Patient has severe unremitting pain that affects sleep, daily activities, and work/hobbies. After pre-op clearance the patient was taken to the operating room on 08/04/2022 and underwent  Procedure(s): LEFT TOTAL HIP ARTHROPLASTY ANTERIOR APPROACH.    Patient was given perioperative antibiotics:  Anti-infectives (From admission, onward)    Start     Dose/Rate Route Frequency Ordered Stop   08/04/22 2000  ceFAZolin (ANCEF) IVPB 2g/100 mL premix        2 g 200 mL/hr over 30 Minutes Intravenous Every 6 hours 08/04/22 1653 08/05/22 0356   08/04/22 1030  ceFAZolin (ANCEF) IVPB 2g/100 mL premix        2 g 200 mL/hr over 30 Minutes Intravenous On call to O.R. 08/04/22 1019 08/04/22 1325        Patient was given sequential compression devices, early ambulation, and chemoprophylaxis to prevent DVT.  Patient benefited maximally from hospital stay and there were no complications.    Recent vital signs: No data found.   Recent laboratory studies:  Recent Labs    08/05/22 0340  WBC 16.1*  HGB 13.6  HCT 39.9  PLT 149*  NA 134*  K 3.8  CL 103  CO2 22  BUN 17  CREATININE 0.95  GLUCOSE 170*  CALCIUM 8.3*      Discharge Medications:   Allergies as of 08/05/2022   No Known Allergies      Medication List     STOP taking these medications    meloxicam 15 MG tablet Commonly known as: MOBIC       TAKE these medications    acetaminophen 500 MG tablet Commonly known as: TYLENOL Take 1,000 mg by mouth every 6 (six) hours as needed for moderate pain.   aspirin 81 MG chewable tablet Chew 1 tablet (81 mg total) by mouth 2 (two) times daily for 20 days. Then take one 81 mg aspirin once a day for three weeks. Then discontinue aspirin.   atorvastatin 10 MG tablet Commonly known as: LIPITOR Take 10 mg by mouth daily.   diphenhydrAMINE 25 MG tablet Commonly known as: BENADRYL Take 25 mg by mouth at bedtime.   HYDROcodone-acetaminophen 5-325 MG tablet Commonly known as: NORCO/VICODIN Take 1-2 tablets by mouth every 6 (six) hours as needed for severe pain.   metFORMIN 500 MG tablet Commonly known as: GLUCOPHAGE Take 500 mg by mouth daily.   methocarbamol 500 MG tablet Commonly known as: ROBAXIN Take 1 tablet (500 mg total) by mouth every 6 (six) hours as needed for muscle spasms.   multivitamin with minerals Tabs tablet Take 1 tablet by mouth 2 (two) times a week.   ondansetron 4 MG tablet Commonly known as: ZOFRAN Take  1 tablet (4 mg total) by mouth every 6 (six) hours as needed for nausea.   PROBIOTIC PO Take 1 capsule by mouth daily as needed (upset stomach).   Systane 0.4-0.3 % Soln Generic drug: Polyethyl Glycol-Propyl Glycol Place 1 drop into both eyes 3 (three) times daily as needed (dry eyes).   traMADol 50 MG tablet Commonly known as: ULTRAM Take 1-2 tablets (50-100 mg total) by mouth every 6 (six) hours as needed for moderate pain. What changed:  how much to take when to take this               Discharge Care Instructions  (From admission, onward)           Start     Ordered   08/05/22 0000  Weight bearing as tolerated        08/05/22 0843    08/05/22 0000  Change dressing       Comments: You have an adhesive waterproof bandage over the incision. Leave this in place until your first follow-up appointment. Once you remove this you will not need to place another bandage.   08/05/22 0843            Diagnostic Studies: DG Pelvis Portable  Result Date: 08/04/2022 CLINICAL DATA:  Postoperative left hip. EXAM: PORTABLE PELVIS 1-2 VIEWS COMPARISON:  CT left hip 07/07/2022 FINDINGS: Interval total left hip arthroplasty. No perihardware lucency is seen to indicate hardware failure or loosening. Mild superior right femoroacetabular joint space narrowing and superolateral right acetabular degenerative osteophytosis. Mild bilateral sacroiliac subchondral sclerosis. The pubic symphysis joint space is maintained. Expected left hip postoperative subcutaneous air. Mild L2-3 disc space narrowing with moderate left greater than right endplate osteophytes. IMPRESSION: Interval total left hip arthroplasty without evidence of hardware failure. Electronically Signed   By: Neita Garnet M.D.   On: 08/04/2022 16:16   DG HIP UNILAT WITH PELVIS 1V LEFT  Result Date: 08/04/2022 CLINICAL DATA:  Intraoperative fluoroscopy for total left hip arthroplasty. EXAM: DG HIP (WITH OR WITHOUT PELVIS) 1V*L* COMPARISON:  CT left hip 07/07/2022 FINDINGS: Images were performed intraoperatively without the presence of a radiologist. Severe superior left mastoid air osteoarthritis. The patient is undergoing total left hip arthroplasty. No hardware complication is seen. Total fluoroscopy images: 7 Total fluoroscopy time: 6 seconds Total dose: Radiation Exposure Index (as provided by the fluoroscopic device): 1.04 mGy air Kerma Please see intraoperative findings for further detail. IMPRESSION: Intraoperative fluoroscopy for total left hip arthroplasty. Electronically Signed   By: Neita Garnet M.D.   On: 08/04/2022 16:15   DG C-Arm 1-60 Min-No Report  Result Date:  08/04/2022 Fluoroscopy was utilized by the requesting physician.  No radiographic interpretation.   CT HIP LEFT WO CONTRAST  Result Date: 07/18/2022 CLINICAL DATA:  Left hip pain for 1 month EXAM: CT OF THE LEFT HIP WITHOUT CONTRAST TECHNIQUE: Multidetector CT imaging of the left hip was performed according to the standard protocol. Multiplanar CT image reconstructions were also generated. RADIATION DOSE REDUCTION: This exam was performed according to the departmental dose-optimization program which includes automated exposure control, adjustment of the mA and/or kV according to patient size and/or use of iterative reconstruction technique. COMPARISON:  None Available. FINDINGS: Bones/Joint/Cartilage Generalized osteopenia. No fracture or dislocation. Normal alignment. No joint effusion. Severe osteoarthritis of the left hip. Mild osteoarthritis of the left SI joint. Degenerative disease with severe disc height loss at L4-5 with bilateral facet arthropathy. Bilateral facet arthropathy at L5-S1. Prior laminectomy at L4-5 and L5-S1.  Ligaments Ligaments are suboptimally evaluated by CT. Muscles and Tendons Muscles are normal. No muscle atrophy. No intramuscular fluid collection or hematoma. Soft tissue No fluid collection or hematoma.  No soft tissue mass. IMPRESSION: 1. No acute osseous injury of the left hip. 2. Severe osteoarthritis of the left hip. Electronically Signed   By: Elige Ko M.D.   On: 07/18/2022 10:16    Disposition: Discharge disposition: 01-Home or Self Care       Discharge Instructions     Call MD / Call 911   Complete by: As directed    If you experience chest pain or shortness of breath, CALL 911 and be transported to the hospital emergency room.  If you develope a fever above 101 F, pus (white drainage) or increased drainage or redness at the wound, or calf pain, call your surgeon's office.   Change dressing   Complete by: As directed    You have an adhesive waterproof  bandage over the incision. Leave this in place until your first follow-up appointment. Once you remove this you will not need to place another bandage.   Constipation Prevention   Complete by: As directed    Drink plenty of fluids.  Prune juice may be helpful.  You may use a stool softener, such as Colace (over the counter) 100 mg twice a day.  Use MiraLax (over the counter) for constipation as needed.   Diet - low sodium heart healthy   Complete by: As directed    Do not sit on low chairs, stoools or toilet seats, as it may be difficult to get up from low surfaces   Complete by: As directed    Driving restrictions   Complete by: As directed    No driving for two weeks   Post-operative opioid taper instructions:   Complete by: As directed    POST-OPERATIVE OPIOID TAPER INSTRUCTIONS: It is important to wean off of your opioid medication as soon as possible. If you do not need pain medication after your surgery it is ok to stop day one. Opioids include: Codeine, Hydrocodone(Norco, Vicodin), Oxycodone(Percocet, oxycontin) and hydromorphone amongst others.  Long term and even short term use of opiods can cause: Increased pain response Dependence Constipation Depression Respiratory depression And more.  Withdrawal symptoms can include Flu like symptoms Nausea, vomiting And more Techniques to manage these symptoms Hydrate well Eat regular healthy meals Stay active Use relaxation techniques(deep breathing, meditating, yoga) Do Not substitute Alcohol to help with tapering If you have been on opioids for less than two weeks and do not have pain than it is ok to stop all together.  Plan to wean off of opioids This plan should start within one week post op of your joint replacement. Maintain the same interval or time between taking each dose and first decrease the dose.  Cut the total daily intake of opioids by one tablet each day Next start to increase the time between doses. The last  dose that should be eliminated is the evening dose.      TED hose   Complete by: As directed    Use stockings (TED hose) for three weeks on both leg(s).  You may remove them at night for sleeping.   Weight bearing as tolerated   Complete by: As directed         Follow-up Information     Aluisio, Homero Fellers, MD. Schedule an appointment as soon as possible for a visit in 2 week(s).   Specialty: Orthopedic  Surgery Contact information: [address] [place] [address] [phone]                  Signed: Arther Abbott 08/06/2022, 12:16 PM

## 2023-02-16 ENCOUNTER — Telehealth: Payer: Self-pay | Admitting: Pulmonary Disease

## 2023-02-16 NOTE — Telephone Encounter (Signed)
 Please call patient and offer 2/18 with Dr. Celine Mans and add to cancellation list

## 2023-02-16 NOTE — Telephone Encounter (Signed)
 Looked to see when next avail for a consult would be at [facility] location and the first avail consult if I looked correctly is not until March 14.  Routing to [place] for help with this to see what she recommends especially if pt is needing an  urgent appt.

## 2023-02-16 NOTE — Telephone Encounter (Signed)
 Mitzi Davenport wife states patient needs urgent pulmonary consult. Referred by Lisabeth Pick PA. States patient needs appointment for shortness of breath and low oxygen levels. [place] phone number is [phone] and [phone].

## 2023-02-22 NOTE — Progress Notes (Unsigned)
Jose Cervantes, male    DOB: 11-Sep-1932    MRN: [id number]   Brief patient profile:  8  yowm never smoker  referred to pulmonary clinic    02/23/2023 by PA for DR Constance Haw for cough since Feb 2024  plus then followed sob.   CT sinus ok / echo ok  Levaquin/prednisone  helped x 30 days by ? When CT    History of Present Illness  02/23/2023  Pulmonary/ 1st office eval/ [facility] / [place] [facility]  - on omeprazole 40 mg    Chief Complaint  Patient presents with   Consult    Cough, fatigue.  Has SOB both with exertion and at rest.  Low SaO2.  Recurrent sinus infections.  Sx x 2 months.   Dyspnea:  x  one flight / none walking at nl pace  Cough: rattle minimally productive sev times a week wakes up with it even p benadryl  Sleep: flat bed 2 pillows  SABA use: albuterol helps some if takes before bed  02: none     No obvious day to day or daytime pattern/variability or assoc excess/ purulent sputum or mucus plugs or hemoptysis or cp or chest tightness, subjective wheeze or overt sinus or hb symptoms.    Also denies any obvious fluctuation of symptoms with weather or environmental changes or other aggravating or alleviating factors except as outlined above   No unusual exposure hx or h/o childhood pna/ asthma or knowledge of premature birth.  Current Allergies, Complete Past Medical History, Past Surgical History, Family History, and Social History were reviewed in [facility] record.  ROS  The following are not active complaints unless bolded Hoarseness, sore throat, dysphagia, dental problems, itching, sneezing,  nasal congestion or discharge of excess mucus or purulent secretions, ear ache,   fever, chills, sweats, unintended wt loss or wt gain, classically pleuritic or exertional cp,  orthopnea pnd or arm/hand swelling  or leg swelling, presyncope, palpitations, abdominal pain, anorexia, nausea, vomiting, diarrhea  or change in bowel habits or change in bladder  habits, change in stools or change in urine, dysuria, hematuria,  rash, arthralgias, visual complaints, headache, numbness, weakness or ataxia or problems with walking or coordination,  change in mood or  memory.            Outpatient Medications Prior to Visit  Medication Sig Dispense Refill   acetaminophen (TYLENOL) 500 MG tablet Take 1,000 mg by mouth every 6 (six) hours as needed for moderate pain.     atorvastatin (LIPITOR) 10 MG tablet Take 10 mg by mouth daily.     diphenhydrAMINE (BENADRYL) 25 MG tablet Take 25 mg by mouth at bedtime.     meloxicam (MOBIC) 7.5 MG tablet Take 7.5 mg by mouth daily.     metFORMIN (GLUCOPHAGE) 500 MG tablet Take 500 mg by mouth daily.     Multiple Vitamin (MULTIVITAMIN WITH MINERALS) TABS tablet Take 1 tablet by mouth 2 (two) times a week.     omeprazole (PRILOSEC) 40 MG capsule Take 40 mg by mouth in the morning and at bedtime.     Polyethyl Glycol-Propyl Glycol (SYSTANE) 0.4-0.3 % SOLN Place 1 drop into both eyes 3 (three) times daily as needed (dry eyes).     Probiotic Product (PROBIOTIC PO) Take 1 capsule by mouth daily as needed (upset stomach).     HYDROcodone-acetaminophen (NORCO/VICODIN) 5-325 MG tablet Take 1-2 tablets by mouth every 6 (six) hours as needed for severe pain. (  Patient not taking: Reported on 02/23/2023) 42 tablet 0   methocarbamol (ROBAXIN) 500 MG tablet Take 1 tablet (500 mg total) by mouth every 6 (six) hours as needed for muscle spasms. (Patient not taking: Reported on 02/23/2023) 40 tablet 0   ondansetron (ZOFRAN) 4 MG tablet Take 1 tablet (4 mg total) by mouth every 6 (six) hours as needed for nausea. (Patient not taking: Reported on 02/23/2023) 20 tablet 0   traMADol (ULTRAM) 50 MG tablet Take 1-2 tablets (50-100 mg total) by mouth every 6 (six) hours as needed for moderate pain. (Patient not taking: Reported on 02/23/2023) 40 tablet 0   No facility-administered medications prior to visit.    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   Depression    Diabetes mellitus without complication (HCC)    Presence of permanent cardiac pacemaker    Vision decreased    right eye since birth      Objective:     BP 134/74 (BP Location: Left Arm, Patient Position: Sitting, Cuff Size: Large)   Pulse 84   Temp 97.9 F (36.6 C) (Oral)   Ht 5\' 8"  (1.727 m)   Wt 207 lb 9.6 oz (94.2 kg)   SpO2 97%   BMI 31.57 kg/m   SpO2: 97 % RA  amb mod obese ( by BMI) wm / gravely voice   HEENT : Oropharynx  ***     Nasal turbinates ***   NECK :  without  apparent JVD/ palpable Nodes/TM    LUNGS: no acc muscle use,  Nl contour chest/ insp /exp rhonchi R > L   coughs end end of out    CV:  RRR  no s3 or murmur or increase in P2, and no edema   ABD: obese  soft and nontender   MS:  Gait nl  ext warm without deformities Or obvious joint restrictions  calf tenderness, cyanosis or clubbing    SKIN: warm and dry without lesions    NEURO:  alert, approp, nl sensorium with  no motor or cerebellar deficits apparent.  CXR PA and Lateral:   02/23/2023 :    I personally reviewed images and impression is as follows:     Elevated LHD with lots of intestinal gas/ no def as dz   Assessment   No problem-specific Assessment & Plan notes found for this encounter.     Sandrea Hughs, MD 02/23/2023

## 2023-02-23 ENCOUNTER — Ambulatory Visit: Payer: Medicare Other | Admitting: Internal Medicine

## 2023-02-23 ENCOUNTER — Ambulatory Visit (INDEPENDENT_AMBULATORY_CARE_PROVIDER_SITE_OTHER): Payer: Medicare Other

## 2023-02-23 ENCOUNTER — Encounter: Payer: Self-pay | Admitting: Internal Medicine

## 2023-02-23 VITALS — BP 134/74 | HR 84 | Temp 97.9°F | Ht 68.0 in | Wt 207.6 lb

## 2023-02-23 DIAGNOSIS — R058 Other specified cough: Secondary | ICD-10-CM | POA: Diagnosis not present

## 2023-02-23 DIAGNOSIS — J4489 Other specified chronic obstructive pulmonary disease: Secondary | ICD-10-CM | POA: Diagnosis not present

## 2023-02-23 DIAGNOSIS — J4541 Moderate persistent asthma with (acute) exacerbation: Secondary | ICD-10-CM | POA: Insufficient documentation

## 2023-02-23 DIAGNOSIS — J45991 Cough variant asthma: Secondary | ICD-10-CM | POA: Insufficient documentation

## 2023-02-23 LAB — CBC WITH DIFFERENTIAL/PLATELET
Basophils Absolute: 0.1 10*3/uL (ref 0.0–0.1)
Basophils Relative: 0.8 % (ref 0.0–3.0)
Eosinophils Absolute: 0.6 10*3/uL (ref 0.0–0.7)
Eosinophils Relative: 7.6 % — ABNORMAL HIGH (ref 0.0–5.0)
HCT: 44.7 % (ref 39.0–52.0)
Hemoglobin: 15.1 g/dL (ref 13.0–17.0)
Lymphocytes Relative: 21.4 % (ref 12.0–46.0)
Lymphs Abs: 1.7 10*3/uL (ref 0.7–4.0)
MCHC: 33.8 g/dL (ref 30.0–36.0)
MCV: 95.3 fL (ref 78.0–100.0)
Monocytes Absolute: 0.7 10*3/uL (ref 0.1–1.0)
Monocytes Relative: 8.3 % (ref 3.0–12.0)
Neutro Abs: 5 10*3/uL (ref 1.4–7.7)
Neutrophils Relative %: 61.9 % (ref 43.0–77.0)
Platelets: 145 10*3/uL — ABNORMAL LOW (ref 150.0–400.0)
RBC: 4.69 Mil/uL (ref 4.22–5.81)
RDW: 13 % (ref 11.5–15.5)
WBC: 8.1 10*3/uL (ref 4.0–10.5)

## 2023-02-23 MED ORDER — BUDESONIDE-FORMOTEROL FUMARATE 80-4.5 MCG/ACT IN AERO
INHALATION_SPRAY | RESPIRATORY_TRACT | 12 refills | Status: DC
Start: 2023-02-23 — End: 2023-03-26

## 2023-02-23 MED ORDER — TRAMADOL HCL 50 MG PO TABS
50.0000 mg | ORAL_TABLET | ORAL | 0 refills | Status: DC | PRN
Start: 2023-02-23 — End: 2023-05-12

## 2023-02-23 MED ORDER — BREZTRI AEROSPHERE 160-9-4.8 MCG/ACT IN AERO: 2.0000 | INHALATION_SPRAY | Freq: Two times a day (BID) | RESPIRATORY_TRACT | Status: DC

## 2023-02-23 NOTE — Patient Instructions (Addendum)
Plan A = Automatic = Always=    Breztri (Symbicort 80) Take 2 puffs first thing in am and then another 2 puffs about 12 hours later.    Work on inhaler technique:  relax and gently blow all the way out then take a nice smooth full deep breath back in, triggering the inhaler at same time you start breathing in.  Hold breath in for at least  5 seconds if you can. Blow out breztri / symbicort 80  thru nose. Rinse and gargle with water when done.  If mouth or throat bother you at all,  try brushing teeth/gums/tongue with arm and hammer toothpaste/ make a slurry and gargle and spit out.      Plan B = Backup (to supplement plan A, not to replace it) Only use your albuterol inhaler as a rescue medication to be used if you can't catch your breath by resting or doing a relaxed purse lip breathing pattern.  - The less you use it, the better it will work when you need it. - Ok to use the inhaler up to 2 puffs  every 4 hours if you must but call for appointment if use goes up over your usual need - Don't leave home without it !!  (think of it like the spare tire for your car)    Omeprazole should Take 30- 60 min before your first and last meals of the day    GERD (REFLUX)  is an extremely common cause of respiratory symptoms just like yours , many times with no obvious heartburn at all.    It can be treated with medication, but also with lifestyle changes including elevation of the head of your bed (ideally with 6 -8inch blocks under the headboard of your bed),  Smoking cessation, avoidance of late meals, excessive alcohol, and avoid fatty foods, chocolate, peppermint, colas, red wine, and acidic juices such as orange juice.  NO MINT OR MENTHOL PRODUCTS SO NO COUGH DROPS  (Ludens ok)  USE SUGARLESS CANDY INSTEAD (Jolley ranchers or [employer] or Environmental manager) or even ice chips will also do - the key is to swallow to prevent all throat clearing. NO OIL BASED VITAMINS - use powdered substitutes.  Avoid fish oil  when coughing.   For cough/ congestion >  mucinex dm  up to maximum of  1200 mg every 12 hours as needed supplement with tramadol if needed up to every 4 hours for non-stop cough    Please remember to go to the  x-ray department  for your tests - we will call you with the results when they are available    Please remember to go to the lab department   for your tests - we will call you with the results when they are available.      Please schedule a follow up office visit in 4 weeks, sooner if needed  with all medications /inhalers/ solutions in hand so we can verify exactly what you are taking. This includes all medications from all doctors and over the counters

## 2023-02-24 LAB — IGE: IgE (Immunoglobulin E), Serum: 327 kU/L — ABNORMAL HIGH (ref <=114)

## 2023-02-24 NOTE — Assessment & Plan Note (Addendum)
Onset Feb 2024  -  08/06/22   EOS  0.6  - .Allergy screen 02/23/2023 >  Eos 0. /  IgE  pending  - 02/23/2023  After extensive coaching inhaler device,  effectiveness =    75% from a baseline of near 0 so try Breztri sample and symbicort 80 2bid and max mucinex dm x 4 weeks and then return  Clinically he clearly has chronic bronchitis with reported nl ent w/u so no further abx rec for now but rather try to eliminate cyclical coughing and treat the Th2 component without systemic steroids if possible acknowledging the higher doses of ICS or DPI ICS are likely to make cough much worse.   Of the three most common causes of  Sub-acute / recurrent or chronic cough, only one (GERD)  can actually contribute to/ trigger  the other two (asthma and post nasal drip syndrome)  and perpetuate the cylce of cough.  While not intuitively obvious, many patients with chronic low grade reflux do not cough until there is a primary insult that disturbs the protective epithelial barrier and exposes sensitive nerve endings.   This is typically viral but can due to PNDS and  either may apply here.   The point is that once this occurs, it is difficult to eliminate the cycle  using anything but a maximally effective acid suppression regimen at least in the short run, accompanied by an appropriate diet to address non acid GERD and control / eliminate the cough itself with max mucinex dm and hard rock cand to promote swallowing to clear throat and tramadol short term only          Each maintenance medication was reviewed in detail including emphasizing most importantly the difference between maintenance and prns and under what circumstances the prns are to be triggered using an action plan format where appropriate.  Total time for H and P, chart review, counseling, reviewing hfa device(s) and generating customized AVS unique to this office visit / same day charting  > 60 min for new pt with  refractory respiratory  symptoms of  uncertain etiology

## 2023-02-25 ENCOUNTER — Other Ambulatory Visit: Payer: Self-pay | Admitting: Internal Medicine

## 2023-02-25 DIAGNOSIS — J4489 Other specified chronic obstructive pulmonary disease: Secondary | ICD-10-CM

## 2023-02-25 NOTE — Progress Notes (Signed)
Spoke with pt and notified of results per Dr. Sherene Sires. Pt verbalized understanding and denied any questions. He was agreeable to allergy referral and this has been placed.

## 2023-03-09 ENCOUNTER — Telehealth: Payer: Self-pay

## 2023-03-09 NOTE — Telephone Encounter (Signed)
Patient called and expressed that he called BC/BS to give them the scratch testing code. BC/BS informed patient that codes could not be found. Patient wanted to talk to billing to see what other codes can be used.

## 2023-03-13 NOTE — Telephone Encounter (Signed)
 Called and spoke to patient and informed him that the code have yet to change. Patient stated that he will attempt the codes again with BC/BS. If no positive results he may change his appointment.

## 2023-03-24 ENCOUNTER — Encounter: Payer: Self-pay | Admitting: Allergy and Immunology

## 2023-03-24 ENCOUNTER — Ambulatory Visit: Payer: Medicare Other | Admitting: Allergy and Immunology

## 2023-03-24 ENCOUNTER — Telehealth: Payer: Self-pay | Admitting: Allergy and Immunology

## 2023-03-24 ENCOUNTER — Other Ambulatory Visit: Payer: Self-pay

## 2023-03-24 VITALS — BP 126/78 | HR 77 | Temp 98.3°F | Resp 16 | Ht 66.54 in | Wt 204.0 lb

## 2023-03-24 DIAGNOSIS — J3089 Other allergic rhinitis: Secondary | ICD-10-CM | POA: Diagnosis not present

## 2023-03-24 DIAGNOSIS — K219 Gastro-esophageal reflux disease without esophagitis: Secondary | ICD-10-CM

## 2023-03-24 DIAGNOSIS — J324 Chronic pansinusitis: Secondary | ICD-10-CM

## 2023-03-24 DIAGNOSIS — J4540 Moderate persistent asthma, uncomplicated: Secondary | ICD-10-CM

## 2023-03-24 DIAGNOSIS — R430 Anosmia: Secondary | ICD-10-CM | POA: Diagnosis not present

## 2023-03-24 DIAGNOSIS — D7219 Other eosinophilia: Secondary | ICD-10-CM

## 2023-03-24 MED ORDER — AZELASTINE HCL 0.1 % NA SOLN: 1.0000 | Freq: Two times a day (BID) | NASAL | 1 refills | Status: DC

## 2023-03-24 MED ORDER — FLUTICASONE PROPIONATE 50 MCG/ACT NA SUSP: 1.0000 | Freq: Two times a day (BID) | NASAL | 1 refills | Status: DC

## 2023-03-24 MED ORDER — FAMOTIDINE 40 MG PO TABS: 40.0000 mg | ORAL_TABLET | Freq: Every evening | ORAL | 1 refills | Status: DC

## 2023-03-24 MED ORDER — OXYMETAZOLINE HCL 0.05 % NA SOLN: 1.0000 | Freq: Every evening | NASAL | 1 refills | Status: DC

## 2023-03-24 MED ORDER — OMEPRAZOLE 40 MG PO CPDR: 40.0000 mg | DELAYED_RELEASE_CAPSULE | Freq: Two times a day (BID) | ORAL | 1 refills | Status: DC

## 2023-03-24 NOTE — Telephone Encounter (Signed)
 Patients wife called asking for a prescription for Pepcid to be called in to [facility] pharmacy on [address]

## 2023-03-24 NOTE — Progress Notes (Unsigned)
 [place]   Dear Sherene Sires,  Thank you for referring Jose Cervantes to the [facility] [place] on 03/24/2023.   Below is a summation of this patient's evaluation and recommendations.  Thank you for your referral. I will keep you informed about this patient's response to treatment.   If you have any questions please do not hesitate to contact me.   Sincerely,  Jessica Priest, MD Allergy / Immunology [facility]   ______________________________________________________________________    NEW PATIENT NOTE  Referring Provider: Nyoka Cowden, MD Primary Provider: Rosendo Gros Date of office visit: 03/24/2023    Subjective:   Chief Complaint:  Jose Cervantes (DOB: February 12, 1932) is a 89 y.o. male who presents to the clinic on 03/24/2023 with a chief complaint of Asthma and Sinus Problem (Yearly sinus infection  started in Feb. 2024. the ENT did sinus scan and it was clear. pulmonologist has given him an inhaler and he still has sinus congestion and the inhaler cleared the cough. Here for allergy symptoms ) .     HPI: Daequan presents to this clinic in evaluation of respiratory tract problems.  Over the course of the past year he has developed significant problems with his upper airway and his throat.  The thing that bothers him the most is that he gets so congested at nighttime that it interferes with his sleep.  He cannot pass air through his nose during these periods of time.  And he cannot smell very well.  In addition, he has constant postnasal drip and throat clearing and he has had a prolonged cough.  And on occasion he gets some shortness of breath.  He has seen multiple physicians for these issues.  He saw ENT who treated him with a prolonged course of Levaquin and then obtained a CT scan which apparently identified no chronic sinusitis and  he is also been given omeprazole twice a day although he still remains symptomatic as described above.  He has seen pulmonology, Dr. Sherene Sires, who gave him Symbicort 1 month ago and it actually has improved his cough to some degree.  Past Medical History:  Diagnosis Date  . Anxiety   . Arthritis   . Asthma   . 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 Depression   . Diabetes mellitus without complication (HCC)   . Presence of permanent cardiac pacemaker   . Vision decreased    right eye since birth    Past Surgical History:  Procedure Laterality Date  . APPENDECTOMY    . BACK SURGERY  [phone], 1984   lumbar laminectomy   . CHOLECYSTECTOMY    . COLONOSCOPY    . EYE SURGERY Left    cataract surgery with lens implant  . KNEE ARTHROPLASTY Bilateral   . LUMBAR LAMINECTOMY/DECOMPRESSION MICRODISCECTOMY N/A 05/21/2017   Procedure: LAMINECTOMY AND FORAMINOTOMY LUMBAR THREE- LUMBAR FOUR, LUMBAR FOUR- LUMBAR FIVE, LUMBAR FIVE- SACRAL ONE;  Surgeon: Tressie Stalker, MD;  Location: [facility] OR;  Service: Neurosurgery;  Laterality: N/A;  . MOHS SURGERY Left    ear  . PACEMAKER INSERTION     Sept. 2023  . TONSILLECTOMY    . TOTAL HIP ARTHROPLASTY Left 08/04/2022   Procedure: LEFT TOTAL HIP ARTHROPLASTY ANTERIOR APPROACH;  Surgeon: Ollen Gross, MD;  Location: [facility] ORS;  Service: Orthopedics;  Laterality: Left;  . TRANSURETHRAL  RESECTION OF PROSTATE      Allergies as of 03/24/2023   No Known Allergies      Medication List    acetaminophen 500 MG tablet Commonly known as: TYLENOL Take 1,000 mg by mouth every 6 (six) hours as needed for moderate pain.   albuterol 108 (90 Base) MCG/ACT inhaler Commonly known as: VENTOLIN HFA Inhale 2 puffs into the lungs every 6 (six) hours as needed for wheezing or shortness of breath.   atorvastatin 10 MG tablet Commonly known as: LIPITOR Take 10 mg by mouth daily.   budesonide-formoterol 80-4.5 MCG/ACT inhaler Commonly known as: Symbicort Take 2  puffs first thing in am and then another 2 puffs about 12 hours later.   diphenhydrAMINE 25 MG tablet Commonly known as: BENADRYL Take 25 mg by mouth at bedtime.   fluticasone 50 MCG/ACT nasal spray Commonly known as: FLONASE Place 1 spray into both nostrils daily.   meloxicam 7.5 MG tablet Commonly known as: MOBIC Take 7.5 mg by mouth daily.   metFORMIN 500 MG tablet Commonly known as: GLUCOPHAGE Take 500 mg by mouth daily.   multivitamin with minerals Tabs tablet Take 1 tablet by mouth 2 (two) times a week.   omeprazole 40 MG capsule Commonly known as: PRILOSEC Take 40 mg by mouth in the morning and at bedtime.   PROBIOTIC PO Take 1 capsule by mouth daily as needed (upset stomach).   Systane 0.4-0.3 % Soln Generic drug: Polyethyl Glycol-Propyl Glycol Place 1 drop into both eyes 3 (three) times daily as needed (dry eyes).   traMADol 50 MG tablet Commonly known as: ULTRAM Take 1 tablet (50 mg total) by mouth every 4 (four) hours as needed (severe cough).     Review of systems negative except as noted in HPI / PMHx or noted below:  Review of Systems  Constitutional: Negative.   HENT: Negative.    Eyes: Negative.   Respiratory: Negative.    Cardiovascular: Negative.   Gastrointestinal: Negative.   Genitourinary: Negative.   Musculoskeletal: Negative.   Skin: Negative.   Neurological: Negative.   Endo/Heme/Allergies: Negative.   Psychiatric/Behavioral: Negative.      Family History  Problem Relation Age of Onset  . Diabetes Mellitus II Mother   . Hypertension Mother   . Congestive Heart Failure Father   . Heart disease Father   . Breast cancer Sister   . Bone cancer Sister     Social History   Socioeconomic History  . Marital status: Married    Spouse name: Not on file  . Number of children: Not on file  . Years of education: Not on file  . Highest education level: Not on file  Occupational History  . Not on file  Tobacco Use  . Smoking status:  Never  . Smokeless tobacco: Never  Vaping Use  . Vaping status: Never Used  Substance and Sexual Activity  . Alcohol use: Yes    Alcohol/week: 2.0 standard drinks of alcohol    Types: 2 Shots of liquor per week    Comment: weekly  . Drug use: Never  . Sexual activity: Not on file  Other Topics Concern  . Not on file  Social History Narrative  . Not on file   Social Drivers of Health   Financial Resource Strain: Low Risk  (03/20/2023)   Received from [facility]   Overall Financial Resource Strain (CARDIA)   . Difficulty of Paying Living Expenses: Not hard at all  Food Insecurity: No Food  Insecurity (03/20/2023)   Received from [facility]   Hunger Vital Sign   . Worried About Programme researcher, broadcasting/film/video in the Last Year: Never true   . Ran Out of Food in the Last Year: Never true  Transportation Needs: No Transportation Needs (03/20/2023)   Received from [facility] - Transportation   . Lack of Transportation (Medical): No   . Lack of Transportation (Non-Medical): No  Physical Activity: Unknown (03/20/2023)   Received from [facility]   Exercise Vital Sign   . Days of Exercise per Week: 0 days   . Minutes of Exercise per Session: Not on file  Stress: No Stress Concern Present (03/20/2023)   Received from [facility] - Occupational Stress Questionnaire   . Feeling of Stress : Not at all  Social Connections: Socially Integrated (03/20/2023)   Received [facility]   Social Network   . How would you rate your social network (family, work, friends)?: Good participation with social networks  Intimate Partner Violence: Not At Risk (03/20/2023)   Received from [facility]   HITS   . Over the last 12 months how often did your partner physically hurt you?: Never   . Over the last 12 months how often did your partner insult you or talk down to you?: Never   . Over the last 12 months how often did your partner threaten you  with physical harm?: Never   . Over the last 12 months how often did your partner scream or curse at you?: Never    Environmental and Social history  Lives in a townhouse with a dry environment, no animals located inside the household, carpet in the bedroom, no plastic on the bed, no plastic on the pillow, no smoking ongoing inside household.  She retired Technical sales engineer. Objective:   Vitals:   03/24/23 0956  BP: 126/78  Pulse: 77  Resp: 16  Temp: 98.3 F (36.8 C)  SpO2: 96%   Height: 5' 6.54" (169 cm) Weight: 204 lb (92.5 kg)  Physical Exam Constitutional:      Appearance: He is not diaphoretic.     Comments: Constant throat clearing.  HENT:     Head: Normocephalic.     Right Ear: Tympanic membrane, ear canal and external ear normal.     Left Ear: Tympanic membrane, ear canal and external ear normal.     Nose: Mucosal edema present. No rhinorrhea.     Mouth/Throat:     Pharynx: Uvula midline. No oropharyngeal exudate.  Eyes:     Conjunctiva/sclera: Conjunctivae normal.  Neck:     Thyroid: No thyromegaly.     Trachea: Trachea normal. No tracheal tenderness or tracheal deviation.  Cardiovascular:     Rate and Rhythm: Normal rate and regular rhythm.     Heart sounds: Normal heart sounds, S1 normal and S2 normal. No murmur heard. Pulmonary:     Effort: No respiratory distress.     Breath sounds: Normal breath sounds. No stridor. No wheezing or rales.  Lymphadenopathy:     Head:     Right side of head: No tonsillar adenopathy.     Left side of head: No tonsillar adenopathy.     Cervical: No cervical adenopathy.  Skin:    Findings: No erythema or rash.     Nails: There is no clubbing.  Neurological:     Mental Status: He is alert.    Diagnostics: Allergy skin tests  were performed.   Spirometry was performed and demonstrated an FEV1 of 2.61 @ 113 % of predicted. FEV1/FVC = 0.78  The patient had an Asthma Control Test with the following results:  .     Assessment and  Plan:    1. Perennial allergic rhinitis   2. LPRD (laryngopharyngeal reflux disease)   3. Other eosinophilia     Patient Instructions   1. Blood - IgA/G/M  2. Treat and prevent inflammation of airway:   A. Every night use the following:    - OTC Afrin - 1 spray single nostril; alternate nostril nightly   - Fluticasone - 1 spray each nostril   - Azelastine - 1 spray each nostril   B. Every morning use the following:   - Fluticasone - 1 spray each nostril   - Azelastine - 1 spray each nostril  3. Treat and prevent reflux induced inflammation of airway:   A. Consolidate caffeine consumption  B. Omeprazole 40 mg - 2 times per day  C. Famotidine 40 mg - 1 tablet in evening  4. If needed:   A. Nasal saline  5. Return to clinic for skin testing (no antihistamines)  6. Influenza = Tamiflu. Covid = Paxlovid   Jessica Priest, MD Allergy / Immunology [facility] [place]

## 2023-03-24 NOTE — Telephone Encounter (Signed)
 Called and notified wife that the medications have been sent in. I did inform her that due to him being seen today it could take a little while to sent in. Wife verbalized understanding.

## 2023-03-24 NOTE — Patient Instructions (Addendum)
  1. Blood - IgA/G/M  2. Treat and prevent inflammation of airway:   A. Every night use the following:    - OTC Afrin - 1 spray single nostril; alternate nostril nightly   - Fluticasone - 1 spray each nostril   - Azelastine - 1 spray each nostril   B. Every morning use the following:   - Fluticasone - 1 spray each nostril   - Azelastine - 1 spray each nostril  3. Treat and prevent reflux induced inflammation of airway:   A. Consolidate caffeine consumption  B. Omeprazole 40 mg - 2 times per day  C. Famotidine 40 mg - 1 tablet in evening  4. If needed:   A. Nasal saline  5. Return to clinic for skin testing (no antihistamines)  6. Influenza = Tamiflu. Covid = Paxlovid

## 2023-03-25 ENCOUNTER — Encounter: Payer: Self-pay | Admitting: Allergy and Immunology

## 2023-03-25 LAB — IGG, IGA, IGM
IgA/Immunoglobulin A, Serum: 166 mg/dL (ref 61–437)
IgG (Immunoglobin G), Serum: 787 mg/dL (ref 603–1613)
IgM (Immunoglobulin M), Srm: 33 mg/dL (ref 15–143)

## 2023-03-26 ENCOUNTER — Encounter: Payer: Self-pay | Admitting: Nurse Practitioner

## 2023-03-26 ENCOUNTER — Ambulatory Visit: Payer: Medicare Other | Admitting: Nurse Practitioner

## 2023-03-26 VITALS — BP 123/76 | HR 80 | Ht 67.0 in | Wt 206.0 lb

## 2023-03-26 DIAGNOSIS — J302 Other seasonal allergic rhinitis: Secondary | ICD-10-CM | POA: Diagnosis not present

## 2023-03-26 DIAGNOSIS — J3089 Other allergic rhinitis: Secondary | ICD-10-CM | POA: Diagnosis not present

## 2023-03-26 DIAGNOSIS — J4541 Moderate persistent asthma with (acute) exacerbation: Secondary | ICD-10-CM

## 2023-03-26 DIAGNOSIS — H1010 Acute atopic conjunctivitis, unspecified eye: Secondary | ICD-10-CM | POA: Diagnosis not present

## 2023-03-26 LAB — NITRIC OXIDE: Nitric Oxide: 87

## 2023-03-26 MED ORDER — METHYLPREDNISOLONE ACETATE 80 MG/ML IJ SUSP: 0.8000 mg | Freq: Once | INTRAMUSCULAR | Status: DC

## 2023-03-26 MED ORDER — PREDNISONE 10 MG PO TABS
ORAL_TABLET | ORAL | 0 refills | Status: DC
Start: 2023-03-26 — End: 2023-05-12

## 2023-03-26 MED ORDER — BUDESONIDE-FORMOTEROL FUMARATE 160-4.5 MCG/ACT IN AERO
2.0000 | INHALATION_SPRAY | Freq: Two times a day (BID) | RESPIRATORY_TRACT | 12 refills | Status: DC
Start: 2023-03-26 — End: 2023-05-12

## 2023-03-26 NOTE — Progress Notes (Signed)
 @Patient  ID: Jose Cervantes, male    DOB: 09/01/1932, 89 y.o.   MRN: [id number]  Chief Complaint  Patient presents with   Follow-up    Pt wife states he not doing the best head congestion, been placed on 3 different nasal spray (3 days ), Inhaler has worked well.     Referring provider: Rosendo Gros  HPI: 89 year old male, never smoker followed for asthmatic bronchitis and upper airway cough. He is a patient of Dr. Thurston Hole and last seen in office 02/2023. Past medical history significant for DM, AV block s/p pacemaker.  TEST/EVENTS:  08/06/2022 eos 600 02/23/2023 eos 600, IgE 327 02/23/2023 CXR: clear lungs 03/24/2023 spiro: FVC 105, FEV1 112, ratio 78  02/23/2023: OV with Dr. Sherene Sires for pulm consult. Cough since Feb 2024 followed by SOB. CT sinus ok. Echo ok. Levaquin and prednisone helped. Gets DOE with one flight of stairs; none at level ground. Minimally productive cough; wakes him up at night. No change with benadryl. Persistent sense of nasal congestion. Albuterol does help at bedtime.    03/26/2023: Today - follow up Patient presents today for follow up with his wife, who is a retired [employer] fundraiser. Since his last visit, cough subsided after 2 weeks on Symbicort, which is a huge relief for him. He does still have some trouble with chest tightness/getting a deep breath in at times. Breathing feels relatively unchanged; still gets winded with a flight of stairs. Hasn't noticed any more wheezing. Wants to know how long he'll stay on the Symbicort. It's around $200 a month. He's not sure if this is due to his deductible or non-formulary. He's not been using his rescue inhaler.  He saw Dr. Lucie Leather earlier this week. He adjusted his nasal/sinus regimen. He's using astelin and flonase bid, and afrin nightly, alternating nostrils. With this, he does feel like his mucus is a little looser and not as "cement" like. Finally feels he's getting some relief; although, he does still feel congestion and has  throat clearing. He is supposed to have upcoming allergy skin testing.  Denies fevers, chills, hemoptysis, leg swelling.   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     albuterol (VENTOLIN HFA) 108 (90 Base) MCG/ACT inhaler Inhale 2 puffs into the lungs every 6 (six) hours as needed for wheezing or shortness of breath.     atorvastatin (LIPITOR) 10 MG tablet Take 10 mg by mouth daily.     azelastine (ASTELIN) 0.1 % nasal spray Place 1 spray into both nostrils 2 (two) times daily. 90 mL 1   diphenhydrAMINE (BENADRYL) 25 MG tablet Take 25 mg by mouth at bedtime.     famotidine (PEPCID) 40  MG tablet Take 1 tablet (40 mg total) by mouth at bedtime. 90 tablet 1   fluticasone (FLONASE) 50 MCG/ACT nasal spray Place 1 spray into both nostrils 2 (two) times daily. 48 g 1   meloxicam (MOBIC) 7.5 MG tablet Take 7.5 mg by mouth daily.     metFORMIN (GLUCOPHAGE) 500 MG tablet Take 500 mg by mouth daily.     Multiple Vitamin (MULTIVITAMIN WITH MINERALS) TABS tablet Take 1 tablet by mouth 2 (two) times a week.     omeprazole (PRILOSEC) 40 MG capsule Take 1 capsule (40 mg total) by mouth in the morning and at bedtime. 180 capsule 1   oxymetazoline (AFRIN 12 HOUR) 0.05 % nasal spray Place 1  spray into both nostrils at bedtime. Alternate nostrils every other night. 90 mL 1   Polyethyl Glycol-Propyl Glycol (SYSTANE) 0.4-0.3 % SOLN Place 1 drop into both eyes 3 (three) times daily as needed (dry eyes).     Probiotic Product (PROBIOTIC PO) Take 1 capsule by mouth daily as needed (upset stomach).     traMADol (ULTRAM) 50 MG tablet Take 1 tablet (50 mg total) by mouth every 4 (four) hours as needed (severe cough). 30 tablet 0   Budeson-Glycopyrrol-Formoterol (BREZTRI AEROSPHERE) 160-9-4.8 MCG/ACT AERO Inhale 2 puffs into the lungs in the morning and at bedtime.     budesonide-formoterol (SYMBICORT) 80-4.5 MCG/ACT inhaler Take 2 puffs first thing in am and then another 2 puffs about 12 hours later. 1 each 12   No facility-administered medications prior to visit.     Review of Systems:   Constitutional: No weight loss or gain, night sweats, fevers, chills, fatigue, or lassitude. HEENT: No headaches, difficulty swallowing, tooth/dental problems, or sore throat. No sneezing, itching, ear ache +nasal congestion, post nasal drip CV:  No chest pain, orthopnea, PND, swelling in lower extremities, anasarca, dizziness, palpitations, syncope Resp: +shortness of breath with exertion; chest tightness. No excess mucus or change in color of mucus. No productive or non-productive. No hemoptysis. No wheezing.  No chest wall deformity GI:  No heartburn, indigestion, abdominal pain, nausea, vomiting, diarrhea, change in bowel habits, loss of appetite, bloody stools.  GU: No dysuria, change in color of urine, urgency or frequency.  No flank pain, no hematuria  Skin: No rash, lesions, ulcerations MSK:  No joint pain or swelling.   Neuro: No dizziness or lightheadedness.  Psych: No depression or anxiety. Mood stable.     Physical Exam:  BP 123/76 (BP Location: Left Arm, Patient Position: Sitting, Cuff Size: Normal)   Pulse 80   Ht 5\' 7"  (1.702 m)   Wt 206 lb (93.4 kg)   SpO2 96%   BMI 32.26 kg/m    GEN: Pleasant, interactive, well-appearing; obese; in no acute distress. HEENT:  Normocephalic and atraumatic. PERRLA. Sclera white. Nasal turbinates erythematous, moist and patent bilaterally; septal deviation to the left. White rhinorrhea present. Oropharynx pink and moist, without exudate or edema. No lesions, ulcerations, or postnasal drip.  NECK:  Supple w/ fair ROM. No JVD present. Thyroid symmetrical with no goiter or nodules palpated. No lymphadenopathy.   CV: RRR, no m/r/g, no peripheral edema. Pulses intact, +2 bilaterally. No cyanosis, pallor or clubbing. PULMONARY:  Unlabored, regular breathing. Diminished bilaterally A&P w/o wheezes/rales/rhonchi. No accessory muscle use.  GI: BS present and normoactive. Soft, non-tender to palpation. No organomegaly or masses detected.  MSK: No erythema, warmth or tenderness. Cap refil <2 sec all extrem. No deformities or joint swelling noted.  Neuro: A/Ox3. No  focal deficits noted.   Skin: Warm, no lesions or rashe Psych: Normal affect and behavior. Judgement and thought content appropriate.     Lab Results:  CBC    Component Value Date/Time   WBC 8.1 02/23/2023 1002   RBC 4.69 02/23/2023 1002   HGB 15.1 02/23/2023 1002   HCT 44.7 02/23/2023 1002   PLT 145.0 (L) 02/23/2023 1002   MCV 95.3 02/23/2023 1002   MCH 32.1 08/05/2022 0340   MCHC 33.8 02/23/2023 1002   RDW 13.0 02/23/2023 1002   LYMPHSABS 1.7 02/23/2023 1002   MONOABS 0.7 02/23/2023 1002   EOSABS 0.6 02/23/2023 1002   BASOSABS 0.1 02/23/2023 1002    BMET    Component Value Date/Time   NA 134 (L) 08/05/2022 0340   K 3.8 08/05/2022 0340   CL 103 08/05/2022 0340   CO2 22 08/05/2022 0340   GLUCOSE 170 (H) 08/05/2022 0340   BUN 17 08/05/2022 0340   CREATININE 0.95 08/05/2022 0340   CALCIUM 8.3 (L) 08/05/2022 0340   GFRNONAA >60 08/05/2022 0340   GFRAA >60 05/18/2017 0951    BNP No results found for: "BNP"   Imaging:  No results found.  Administration  History     None           No data to display          Lab Results  Component Value Date   NITRICOXIDE 87 03/26/2023        Assessment & Plan:   Moderate persistent asthmatic bronchitis with exacerbation Asthmatic bronchitis with elevated exhaled nitric oxide testing, consistent with type II inflammation. Very steroid responsive. He has resolved cough; however, continues to have DOE and chest tightness. Slowly improving. Will treat him with depo inj, prednisone burst and increase his ICS dosing of his Symbicort. Reassess response. If he remains difficult to control, may need to consider biologic therapy. He does have an allergic phenotype so could also consider immunotherapy, if recommended by Dr. Lucie Leather, for trigger prevention. No obvious obstruction or restriction on recent spiro. Will have him complete full PFT upon return. Action plan in place.   Patient Instructions  -Continue Albuterol inhaler 2 puffs every 6 hours as needed for shortness of breath or wheezing. Notify if symptoms persist despite rescue inhaler/neb use.  -Continue Symbicort 2 puffs Twice daily. Brush tongue and rinse mouth afterwards. Step up to higher dosed inhaled steroid - new prescription sent to your pharmacy. Call your insurance to see if anything equivalent to Symbicort is better covered or if cost is related to your deductible  -Continue nasal spray regimen as prescribed by Dr. Lucie Leather -Start saline rinses 1-2 times a day with bottled distilled water. Do this about 20-30 minutes before your sprays -Continue omeprazole  -Continue pepcid  You still have quite a bit of inflammation in your airways, which is why you are still having the chest tightness/trouble with getting a deep breath in. We will repeat a course of oral steroids and then increase your Symbicort. Hopefully, this will help get [place] back to a more normal state that we can maintain moving forward  Prednisone taper. 4 tabs for 2 days, then 3  tabs for 2 days, 2 tabs for 2 days, then 1 tab for 2 days, then stop. Take in AM with food. Start tomorrow  We will obtain lung function testing when you come back   Reschedule your allergy appointment for skin testing with Dr. Lucie Leather since you will be on steroids now  Glad your cough is better!  Follow up in 8 weeks after PFT with Dr. Sherene Sires or Philis Nettle. If symptoms do not improve or worsen, please contact office for sooner follow up or seek emergency care.    Allergic rhinoconjunctivitis, seasonal and perennial Follows with Dr. Lucie Leather. Awaiting skin testing. Allergic phenotype on prior testing with peripheral eosinophilia. Seems to be responding well to current regimen. See above   Advised if symptoms do not improve or worsen, to please contact office for sooner follow up or seek emergency care.   I spent 35 minutes of dedicated to the care of this patient on the date of this encounter to include pre-visit review of records, face-to-face time with the patient discussing conditions above, post visit ordering of testing, clinical documentation with the electronic health record, making appropriate referrals as documented, and communicating necessary findings to members of the patients care team.  Noemi Chapel, NP 03/26/2023  Pt aware and understands NP's role.

## 2023-03-26 NOTE — Assessment & Plan Note (Addendum)
 Follows with Dr. Lucie Leather. Awaiting skin testing. Allergic phenotype on prior testing with peripheral eosinophilia. Seems to be responding well to current regimen. See above

## 2023-03-26 NOTE — Patient Instructions (Addendum)
-  Continue Albuterol inhaler 2 puffs every 6 hours as needed for shortness of breath or wheezing. Notify if symptoms persist despite rescue inhaler/neb use.  -Continue Symbicort 2 puffs Twice daily. Brush tongue and rinse mouth afterwards. Step up to higher dosed inhaled steroid - new prescription sent to your pharmacy. Call your insurance to see if anything equivalent to Symbicort is better covered or if cost is related to your deductible  -Continue nasal spray regimen as prescribed by Dr. Lucie Leather -Start saline rinses 1-2 times a day with bottled distilled water. Do this about 20-30 minutes before your sprays -Continue omeprazole  -Continue pepcid  You still have quite a bit of inflammation in your airways, which is why you are still having the chest tightness/trouble with getting a deep breath in. We will repeat a course of oral steroids and then increase your Symbicort. Hopefully, this will help get [place] back to a more normal state that we can maintain moving forward  Prednisone taper. 4 tabs for 2 days, then 3 tabs for 2 days, 2 tabs for 2 days, then 1 tab for 2 days, then stop. Take in AM with food. Start tomorrow  We will obtain lung function testing when you come back   Reschedule your allergy appointment for skin testing with Dr. Lucie Leather since you will be on steroids now   Glad your cough is better!  Follow up in 8 weeks after PFT with Dr. Sherene Sires or Philis Nettle. If symptoms do not improve or worsen, please contact office for sooner follow up or seek emergency care.

## 2023-03-26 NOTE — Assessment & Plan Note (Addendum)
 Asthmatic bronchitis with elevated exhaled nitric oxide testing, consistent with type II inflammation. Very steroid responsive. He has resolved cough; however, continues to have DOE and chest tightness. Slowly improving. Will treat him with depo inj, prednisone burst and increase his ICS dosing of his Symbicort. Reassess response. If he remains difficult to control, may need to consider biologic therapy. He does have an allergic phenotype so could also consider immunotherapy, if recommended by Dr. Lucie Leather, for trigger prevention. No obvious obstruction or restriction on recent spiro. Will have him complete full PFT upon return. Action plan in place.   Patient Instructions  -Continue Albuterol inhaler 2 puffs every 6 hours as needed for shortness of breath or wheezing. Notify if symptoms persist despite rescue inhaler/neb use.  -Continue Symbicort 2 puffs Twice daily. Brush tongue and rinse mouth afterwards. Step up to higher dosed inhaled steroid - new prescription sent to your pharmacy. Call your insurance to see if anything equivalent to Symbicort is better covered or if cost is related to your deductible  -Continue nasal spray regimen as prescribed by Dr. Lucie Leather -Start saline rinses 1-2 times a day with bottled distilled water. Do this about 20-30 minutes before your sprays -Continue omeprazole  -Continue pepcid  You still have quite a bit of inflammation in your airways, which is why you are still having the chest tightness/trouble with getting a deep breath in. We will repeat a course of oral steroids and then increase your Symbicort. Hopefully, this will help get [place] back to a more normal state that we can maintain moving forward  Prednisone taper. 4 tabs for 2 days, then 3 tabs for 2 days, 2 tabs for 2 days, then 1 tab for 2 days, then stop. Take in AM with food. Start tomorrow  We will obtain lung function testing when you come back   Reschedule your allergy appointment for skin testing with  Dr. Lucie Leather since you will be on steroids now   Glad your cough is better!  Follow up in 8 weeks after PFT with Dr. Sherene Sires or Philis Nettle. If symptoms do not improve or worsen, please contact office for sooner follow up or seek emergency care.

## 2023-03-31 ENCOUNTER — Ambulatory Visit: Payer: Medicare Other | Admitting: Allergy and Immunology

## 2023-04-07 ENCOUNTER — Ambulatory Visit: Payer: Medicare Other | Admitting: Allergy and Immunology

## 2023-04-07 DIAGNOSIS — J3089 Other allergic rhinitis: Secondary | ICD-10-CM

## 2023-04-07 NOTE — Patient Instructions (Signed)
  1. Treat and prevent inflammation of airway:   A. Every night use the following:    - OTC Afrin - 1 spray single nostril; alternate nostril nightly   - Fluticasone - 1 spray each nostril   - Azelastine - 1 spray each nostril   B. Every morning use the following:   - Fluticasone - 1 spray each nostril   - Azelastine - 1 spray each nostril   C. Continue Symbicort 160   2. Treat and prevent reflux induced inflammation of airway:   A. Consolidate caffeine consumption  B. Omeprazole 40 mg - 2 times per day  C. Famotidine 40 mg - 1 tablet in evening  4. If needed:   A. Nasal saline  5. Return to clinic in 4 weeks  6. Influenza = Tamiflu. Covid = Paxlovid

## 2023-04-08 ENCOUNTER — Encounter: Payer: Self-pay | Admitting: Allergy and Immunology

## 2023-04-08 NOTE — Progress Notes (Signed)
 Jose Cervantes returns for skin testing.  He did not demonstrate any hypersensitivity against a screening panel of aeroallergens.

## 2023-05-12 ENCOUNTER — Ambulatory Visit (INDEPENDENT_AMBULATORY_CARE_PROVIDER_SITE_OTHER): Admitting: Allergy and Immunology

## 2023-05-12 VITALS — BP 124/74 | HR 74 | Temp 98.0°F | Resp 14 | Ht 66.0 in | Wt 207.1 lb

## 2023-05-12 DIAGNOSIS — J4540 Moderate persistent asthma, uncomplicated: Secondary | ICD-10-CM

## 2023-05-12 DIAGNOSIS — J3089 Other allergic rhinitis: Secondary | ICD-10-CM | POA: Diagnosis not present

## 2023-05-12 DIAGNOSIS — J4541 Moderate persistent asthma with (acute) exacerbation: Secondary | ICD-10-CM | POA: Diagnosis not present

## 2023-05-12 DIAGNOSIS — K219 Gastro-esophageal reflux disease without esophagitis: Secondary | ICD-10-CM | POA: Diagnosis not present

## 2023-05-12 MED ORDER — OMEPRAZOLE 40 MG PO CPDR: 40.0000 mg | DELAYED_RELEASE_CAPSULE | Freq: Two times a day (BID) | ORAL | 1 refills | Status: DC

## 2023-05-12 MED ORDER — FAMOTIDINE 40 MG PO TABS: 40.0000 mg | ORAL_TABLET | Freq: Every evening | ORAL | 1 refills | Status: DC

## 2023-05-12 MED ORDER — AZELASTINE HCL 0.1 % NA SOLN: 1.0000 | Freq: Two times a day (BID) | NASAL | 1 refills | Status: DC

## 2023-05-12 MED ORDER — FLUTICASONE PROPIONATE 50 MCG/ACT NA SUSP: 1.0000 | Freq: Two times a day (BID) | NASAL | 1 refills | Status: DC

## 2023-05-12 MED ORDER — BUDESONIDE-FORMOTEROL FUMARATE 160-4.5 MCG/ACT IN AERO: 2.0000 | INHALATION_SPRAY | Freq: Every morning | RESPIRATORY_TRACT | 1 refills | Status: DC

## 2023-05-12 MED ORDER — OXYMETAZOLINE HCL 0.05 % NA SOLN: 1.0000 | Freq: Every evening | NASAL | 1 refills | Status: DC

## 2023-05-12 NOTE — Progress Notes (Unsigned)
 [place] - [place] - [place] - [place] - [place]   Follow-up Note  Referring Provider: Rosendo Gros Primary Provider: Rosendo Gros Date of Office Visit: 05/12/2023  Subjective:   Jose Cervantes (DOB: 04-13-1932) is a 89 y.o. male who [facility] in re-evaluation of the following:  HPI: Jose Cervantes returns to this clinic in evaluation of asthma, rhinitis, LPR.  I last saw him in this clinic during his initial evaluation of 24 March 2023.  He has resolved almost all of his cough.  He still has some throat clearing.  He has resolved all the issues with his upper airway.  His ability to smell has improved.  He continues on a collection of anti-inflammatory agents for both his upper and lower airway and has been aggressively treating his reflux.  Allergies as of 05/12/2023   No Known Allergies      Medication List    albuterol 108 (90 Base) MCG/ACT inhaler Commonly known as: VENTOLIN HFA Inhale 2 puffs into the lungs every 6 (six) hours as needed for wheezing or shortness of breath.   atorvastatin 10 MG tablet Commonly known as: LIPITOR Take 10 mg by mouth daily.   azelastine 0.1 % nasal spray Commonly known as: ASTELIN Place 1 spray into both nostrils 2 (two) times daily.   budesonide-formoterol 160-4.5 MCG/ACT inhaler Commonly known as: Symbicort Inhale 2 puffs into the lungs 2 (two) times daily.   diphenhydrAMINE 25 MG tablet Commonly known as: BENADRYL Take 25 mg by mouth at bedtime.   famotidine 40 MG tablet Commonly known as: PEPCID Take 1 tablet (40 mg total) by mouth at bedtime.   fluticasone 50 MCG/ACT nasal spray Commonly known as: FLONASE Place 1 spray into both nostrils 2 (two) times daily.   meloxicam 7.5 MG tablet Commonly known as: MOBIC Take 7.5 mg by mouth daily.   metFORMIN 500 MG tablet Commonly known as: GLUCOPHAGE Take 500 mg by mouth daily.   omeprazole 40 MG  capsule Commonly known as: PRILOSEC Take 1 capsule (40 mg total) by mouth in the morning and at bedtime.   oxymetazoline 0.05 % nasal spray Commonly known as: Afrin 12 Hour Place 1 spray into both nostrils at bedtime. Alternate nostrils every other night.   Systane 0.4-0.3 % Soln Generic drug: Polyethyl Glycol-Propyl Glycol Place 1 drop into both eyes 3 (three) times daily as needed (dry eyes).    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   Depression    Diabetes mellitus without complication (HCC)    Presence of permanent cardiac pacemaker    Vision decreased    right eye since birth    Past Surgical History:  Procedure Laterality Date   APPENDECTOMY     BACK SURGERY  [phone], 1984   lumbar laminectomy    CHOLECYSTECTOMY     COLONOSCOPY     EYE SURGERY Left    cataract surgery with lens implant   KNEE ARTHROPLASTY Bilateral    LUMBAR LAMINECTOMY/DECOMPRESSION MICRODISCECTOMY N/A 05/21/2017   Procedure: LAMINECTOMY AND FORAMINOTOMY LUMBAR THREE- LUMBAR FOUR, LUMBAR FOUR- LUMBAR FIVE, LUMBAR FIVE- SACRAL ONE;  Surgeon: Tressie Stalker, MD;  Location: [facility] OR;  Service: Neurosurgery;  Laterality: N/A;   MOHS SURGERY Left    ear   PACEMAKER INSERTION     Sept. 2023  TONSILLECTOMY     TOTAL HIP ARTHROPLASTY Left 08/04/2022   Procedure: LEFT TOTAL HIP ARTHROPLASTY ANTERIOR APPROACH;  Surgeon: Ollen Gross, MD;  Location: [facility] ORS;  Service: Orthopedics;  Laterality: Left;   TRANSURETHRAL RESECTION OF PROSTATE      Review of systems negative except as noted in HPI / PMHx or noted below:  Review of Systems  Constitutional: Negative.   HENT: Negative.    Eyes: Negative.   Respiratory: Negative.    Cardiovascular: Negative.   Gastrointestinal: Negative.   Genitourinary: Negative.   Musculoskeletal: Negative.   Skin: Negative.   Neurological: Negative.   Endo/Heme/Allergies: Negative.    Psychiatric/Behavioral: Negative.       Objective:   Vitals:   05/12/23 1010  BP: 124/74  Pulse: 74  Resp: 14  Temp: 98 F (36.7 C)  SpO2: 97%   Height: 5\' 6"  (167.6 cm)  Weight: 207 lb 1.6 oz (93.9 kg)   Physical Exam Constitutional:      Appearance: He is not diaphoretic.     Comments: Throat clearing  HENT:     Head: Normocephalic.     Right Ear: External ear normal.     Left Ear: External ear normal.     Nose: Nose normal. No mucosal edema or rhinorrhea.     Mouth/Throat:     Pharynx: Uvula midline. No oropharyngeal exudate.  Eyes:     Conjunctiva/sclera: Conjunctivae normal.  Neck:     Thyroid: No thyromegaly.     Trachea: Trachea normal. No tracheal tenderness or tracheal deviation.  Cardiovascular:     Rate and Rhythm: Normal rate and regular rhythm.     Heart sounds: Normal heart sounds, S1 normal and S2 normal. No murmur heard. Pulmonary:     Effort: No respiratory distress.     Breath sounds: Normal breath sounds. No stridor. No wheezing or rales.  Lymphadenopathy:     Head:     Right side of head: No tonsillar adenopathy.     Left side of head: No tonsillar adenopathy.     Cervical: No cervical adenopathy.  Skin:    Findings: No erythema or rash.     Nails: There is no clubbing.  Neurological:     Mental Status: He is alert.     Diagnostics: Spirometry was performed and demonstrated an FEV1 of 2.36 at 102 % of predicted.  Assessment and Plan:   1. Asthma, moderate persistent, well-controlled   2. LPRD (laryngopharyngeal reflux disease)   3. Perennial allergic rhinitis    1. Treat and prevent inflammation of airway:   A. Every night use the following:    - OTC Afrin - 1 spray single nostril; alternate nostril nightly   - Fluticasone - 1 spray each nostril   - Azelastine - 1 spray each nostril   B. Every morning MAY use the following if needed:   - Fluticasone - 1 spray each nostril   - Azelastine - 1 spray each nostril   C.  DECREASE Symbicort 160 - 2 inhalations 1 time per day  2. Treat and prevent reflux induced inflammation of airway:   A. Consolidate caffeine consumption  B. Omeprazole 40 mg - 2 times per day  C. Famotidine 40 mg - 1 tablet in evening  D. Replace all throat clearing with drinking/swallowing maneuver  4. If needed:   A. Nasal saline  5. Return to clinic in 8 weeks. Taper medications???  6. Influenza = Tamiflu. Covid = Paxlovid  Yacine is doing  a lot better with his airway and I think there is an opportunity to consolidate some of his medical treatment and we will have him attempt to discontinue his nasal fluticasone and azelastine used in the morning and we will have him decrease his Symbicort to just 1 time per day.  I had a talk with him today about the need to replace all throat clearing with a drinking and swallowing maneuver and I suspect that if he can do that we may be able to consolidate his therapy for reflux when he returns to this clinic in 8 weeks.  Laurette Schimke, MD Allergy / Immunology  [facility]

## 2023-05-12 NOTE — Patient Instructions (Addendum)
  1. Treat and prevent inflammation of airway:   A. Every night use the following:    - OTC Afrin - 1 spray single nostril; alternate nostril nightly   - Fluticasone - 1 spray each nostril   - Azelastine - 1 spray each nostril   B. Every morning MAY use the following if needed:   - Fluticasone - 1 spray each nostril   - Azelastine - 1 spray each nostril   C. DECREASE Symbicort 160 - 2 inhalations 1 time per day  2. Treat and prevent reflux induced inflammation of airway:   A. Consolidate caffeine consumption  B. Omeprazole 40 mg - 2 times per day  C. Famotidine 40 mg - 1 tablet in evening  D. Replace all throat clearing with drinking/swallowing maneuver  4. If needed:   A. Nasal saline  5. Return to clinic in 8 weeks. Taper medications???  6. Influenza = Tamiflu. Covid = Paxlovid

## 2023-05-13 ENCOUNTER — Encounter: Payer: Self-pay | Admitting: Allergy and Immunology

## 2023-06-11 ENCOUNTER — Ambulatory Visit: Payer: Medicare Other | Admitting: Nurse Practitioner

## 2023-06-11 ENCOUNTER — Ambulatory Visit: Payer: Medicare Other | Admitting: Internal Medicine

## 2023-06-11 ENCOUNTER — Encounter: Payer: Self-pay | Admitting: Nurse Practitioner

## 2023-06-11 VITALS — BP 100/70 | HR 70 | Ht 67.0 in | Wt 205.0 lb

## 2023-06-11 DIAGNOSIS — J4541 Moderate persistent asthma with (acute) exacerbation: Secondary | ICD-10-CM

## 2023-06-11 DIAGNOSIS — J302 Other seasonal allergic rhinitis: Secondary | ICD-10-CM | POA: Diagnosis not present

## 2023-06-11 DIAGNOSIS — J45991 Cough variant asthma: Secondary | ICD-10-CM | POA: Diagnosis not present

## 2023-06-11 DIAGNOSIS — H1010 Acute atopic conjunctivitis, unspecified eye: Secondary | ICD-10-CM | POA: Diagnosis not present

## 2023-06-11 LAB — PULMONARY FUNCTION TEST
DL/VA % pred: 90 %
DL/VA: 3.47 ml/min/mmHg/L
DLCO cor % pred: 99 %
DLCO cor: 21.23 ml/min/mmHg
DLCO unc % pred: 99 %
DLCO unc: 21.23 ml/min/mmHg
FEF 25-75 Post: 2.86 L/s
FEF 25-75 Pre: 2.43 L/s
FEF2575-%Change-Post: 17 %
FEF2575-%Pred-Post: 219 %
FEF2575-%Pred-Pre: 187 %
FEV1-%Change-Post: 3 %
FEV1-%Pred-Post: 131 %
FEV1-%Pred-Pre: 127 %
FEV1-Post: 2.85 L
FEV1-Pre: 2.76 L
FEV1FVC-%Change-Post: 0 %
FEV1FVC-%Pred-Pre: 113 %
FEV6-%Change-Post: 2 %
FEV6-%Pred-Post: 121 %
FEV6-%Pred-Pre: 119 %
FEV6-Post: 3.54 L
FEV6-Pre: 3.47 L
FEV6FVC-%Change-Post: 0 %
FEV6FVC-%Pred-Post: 109 %
FEV6FVC-%Pred-Pre: 109 %
FVC-%Change-Post: 2 %
FVC-%Pred-Post: 111 %
FVC-%Pred-Pre: 109 %
FVC-Post: 3.55 L
FVC-Pre: 3.47 L
Post FEV1/FVC ratio: 80 %
Post FEV6/FVC ratio: 100 %
Pre FEV1/FVC ratio: 79 %
Pre FEV6/FVC Ratio: 100 %
RV % pred: 122 %
RV: 3.26 L
TLC % pred: 106 %
TLC: 6.87 L

## 2023-06-11 LAB — POCT EXHALED NITRIC OXIDE: FeNO level (ppb): 35

## 2023-06-11 NOTE — Progress Notes (Signed)
 @Patient  ID: Jose Cervantes, male    DOB: Nov 13, 1932, 89 y.o.   MRN: [id number]  Chief Complaint  Patient presents with   Follow-up    Pt is doing well    Referring provider: Gustav Lehmann  HPI: 89 year old male, never smoker followed for asthmatic bronchitis and upper airway cough. He is a patient of Dr. Jacqui Mau and last seen in office 03/26/2023 by [facility] NP. Past medical history significant for DM, AV block s/p pacemaker.  TEST/EVENTS:  08/06/2022 eos 600 02/23/2023 eos 600, IgE 327 02/23/2023 CXR: clear lungs 03/24/2023 spiro: FVC 105, FEV1 112, ratio 78 03/26/2023 FeNO 87 ppb 04/07/2023 allergy  skin testing negative  06/11/2023 PFT: FVC 109, FEV1 127, ratio 80, TLC 106, DLCOcor 99  02/23/2023: OV with Dr. Waymond Hailey for pulm consult. Cough since Feb 2024 followed by SOB. CT sinus ok. Echo ok. Levaquin and prednisone  helped. Gets DOE with one flight of stairs; none at level ground. Minimally productive cough; wakes him up at night. No change with benadryl. Persistent sense of nasal congestion. Albuterol does help at bedtime.    03/26/2023: OV with Monae Topping NP for follow up with his wife, who is a retired [employer] fund[employer]. Since his last visit, cough subsided after 2 weeks on Symbicort , which is a huge relief for him. He does still have some trouble with chest tightness/getting a deep breath in at times. Breathing feels relatively unchanged; still gets winded with a flight of stairs. Hasn't noticed any more wheezing. Wants to know how long he'll stay on the Symbicort . It's around $200 a month. He's not sure if this is due to his deductible or non-formulary. He's not been using his rescue inhaler.  He saw Dr. Jerelene Monday earlier this week. He adjusted his nasal/sinus regimen. He's using astelin  and flonase  bid, and afrin nightly, alternating nostrils. With this, he does feel like his mucus is a little looser and not as "cement" like. Finally feels he's getting some relief; although, he does still feel congestion and  has throat clearing. He is supposed to have upcoming allergy  skin testing.  Denies fevers, chills, hemoptysis, leg swelling.  FeNO 87 ppb  06/11/2023: Today - follow up Discussed the use of AI scribe software for clinical note transcription with the patient, who gave verbal consent to proceed.  History of Present Illness   Jose Cervantes is an 89 year old male with asthma who presents for follow-up of his pulmonary function.  Recent lung function testing normal with a slight degree of air trapping. He is currently using Symbicort , two puffs once a day, and has noticed significant improvement in his symptoms.  No current chest tightness or wheezing. No significant cough. Throat clearing is much better with these symptoms being almost non-existent, only occurring in the mornings. His symptoms have improved significantly compared to before he started treatment. Breathing is doing well. No need for albuterol.   He recently saw his cardiologist for a yearly checkup of his pacemaker, and everything was reported to be fine.  He underwent allergy  testing, which was normal. Still on his nasal regimen.   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     atorvastatin  (LIPITOR) 10 MG tablet Take 10 mg by mouth daily.     azelastine  (ASTELIN ) 0.1 % nasal spray Place 1 spray into both nostrils 2 (two) times daily. 90 mL 1   budesonide -formoterol  (SYMBICORT ) 160-4.5 MCG/ACT inhaler Inhale 2 puffs into the lungs every morning. 30.6 g 1   diphenhydrAMINE (BENADRYL) 25 MG tablet Take 25 mg by mouth at bedtime.     famotidine  (PEPCID ) 40 MG tablet Take 1 tablet (40 mg total) by mouth at bedtime. 90 tablet 1   fluticasone  (FLONASE ) 50 MCG/ACT nasal spray Place 1 spray into both nostrils 2 (two) times daily. 48 g 1   meloxicam  (MOBIC ) 7.5 MG tablet Take 7.5 mg by mouth daily.     metFORMIN  (GLUCOPHAGE ) 500 MG tablet Take 500 mg by mouth daily.     omeprazole  (PRILOSEC) 40 MG capsule Take 1 capsule (40 mg total) by mouth in the morning and at bedtime. 180 capsule 1   oxymetazoline  (AFRIN 12 HOUR) 0.05 % nasal spray Place 1 spray into both nostrils at bedtime. Alternate nostrils every other night. 90 mL 1   Polyethyl Glycol-Propyl Glycol (SYSTANE) 0.4-0.3 % SOLN Place 1 drop into both eyes 3 (three) times daily as needed (dry eyes).     Facility-Administered Medications Prior to Visit  Medication Dose Route Frequency Provider Last Rate Last Admin   methylPREDNISolone  acetate (DEPO-MEDROL ) injection 0.8 mg  0.8 mg Intramuscular Once Daleena Rotter, Mariah Shines, NP         Review of Systems:   Constitutional: No weight loss or gain, night sweats, fevers, chills, fatigue, or lassitude. HEENT: No headaches, difficulty swallowing, tooth/dental problems, or sore throat. No sneezing, itching, ear ache +nasal congestion, post nasal drip CV:  No chest pain, orthopnea, PND, swelling in lower extremities, anasarca, dizziness, palpitations, syncope Resp: No shortness of breath with exertion; chest tightness. No excess mucus or change in color of mucus. No productive or non-productive. No hemoptysis. No wheezing.  No chest wall deformity GI:  No heartburn,  indigestion  Skin: No rash, lesions, ulcerations MSK:  No joint pain or swelling.   Neuro: No dizziness or lightheadedness.  Psych: No depression or anxiety. Mood stable.     Physical Exam:  BP 100/70 (BP Location: Left Arm, Patient Position: Sitting, Cuff Size: Large)   Pulse 70   Ht 5\' 7"  (1.702 m)   Wt 205 lb (93 kg)   SpO2 95%   BMI 32.11 kg/m   GEN: Pleasant, interactive, well-appearing; obese; in no acute distress. HEENT:  Normocephalic and atraumatic. PERRLA. Sclera white. Nasal turbinates pink, moist and patent bilaterally; septal deviation to the left. No rhinorrhea present. Oropharynx pink and moist, without exudate or edema. No lesions, ulcerations, or postnasal drip.  NECK:  Supple w/ fair ROM. No JVD present. Thyroid symmetrical with no goiter or nodules palpated. No lymphadenopathy.   CV: RRR, no m/r/g, no peripheral edema. Pulses intact, +2 bilaterally. No cyanosis, pallor or clubbing.  PULMONARY:  Unlabored, regular breathing. Clear bilaterally A&P w/o wheezes/rales/rhonchi. No accessory muscle use.  GI: BS present and normoactive. Soft, non-tender to palpation. No organomegaly or masses detected.  MSK: No erythema, warmth or tenderness. Cap refil <2 sec all extrem. No deformities or joint swelling noted.  Neuro: A/Ox3. No focal deficits noted.   Skin: Warm, no lesions or rashe Psych: Normal affect and behavior. Judgement and thought content appropriate.     Lab Results:  CBC    Component Value Date/Time   WBC 8.1 02/23/2023 1002   RBC 4.69 02/23/2023 1002   HGB 15.1 02/23/2023 1002   HCT 44.7 02/23/2023 1002   PLT 145.0 (L) 02/23/2023 1002   MCV 95.3 02/23/2023 1002   MCH 32.1 08/05/2022 0340   MCHC 33.8 02/23/2023 1002   RDW 13.0 02/23/2023 1002   LYMPHSABS 1.7 02/23/2023 1002   MONOABS 0.7 02/23/2023 1002   EOSABS 0.6 02/23/2023 1002   BASOSABS 0.1 02/23/2023 1002    BMET    Component Value Date/Time   NA 134 (L) 08/05/2022 0340   K 3.8  08/05/2022 0340   CL 103 08/05/2022 0340   CO2 22 08/05/2022 0340   GLUCOSE 170 (H) 08/05/2022 0340   BUN 17 08/05/2022 0340   CREATININE 0.95 08/05/2022 0340   CALCIUM  8.3 (L) 08/05/2022 0340   GFRNONAA >60 08/05/2022 0340   GFRAA >60 05/18/2017 0951    BNP No results found for: "BNP"   Imaging:  No results found.  Administration History     None          Latest Ref Rng & Units 06/11/2023    8:56 AM  PFT Results  FVC-Pre L 3.47  P  FVC-Predicted Pre % 109  P  FVC-Post L 3.55  P  FVC-Predicted Post % 111  P  Pre FEV1/FVC % % 79  P  Post FEV1/FCV % % 80  P  FEV1-Pre L 2.76  P  FEV1-Predicted Pre % 127  P  FEV1-Post L 2.85  P  DLCO uncorrected ml/min/mmHg 21.23  P  DLCO UNC% % 99  P  DLCO corrected ml/min/mmHg 21.23  P  DLCO COR %Predicted % 99  P  DLVA Predicted % 90  P  TLC L 6.87  P  TLC % Predicted % 106  P  RV % Predicted % 122  P    P Preliminary result    Lab Results  Component Value Date   NITRICOXIDE 87 03/26/2023        Assessment & Plan:   Cough variant asthma Cough variant asthma with prior significant elevation in exhaled nitric oxide  testing. Clinically improved with reduced FeNO, indicating improved control. Encouraged him to continue Symbicort  until his follow up with Dr Jerelene Monday and then make a decision on whether to taper off and assess response. Continue trigger prevention. Action plan in place.  Patient Instructions  -Continue Albuterol inhaler 2 puffs every 6 hours as needed for shortness of breath or wheezing. Notify if symptoms persist despite rescue inhaler/neb use.  -Continue Symbicort  how you have been. Brush tongue and rinse mouth afterwards. I think once you go back to see Dr. Jerelene Monday, if things are still stable, we could try you off the Symbicort   -Continue nasal spray regimen as prescribed by Dr. Jerelene Monday -Continue saline rinses 1-2 times a day with bottled distilled water  as needed. Do this about 20-30 minutes before your  sprays -Continue omeprazole   -Continue pepcid    Inflammation in your airways looks  better Lung function testing was normal overall. Some mild air trapping, which is normal in setting of asthma    Follow up in 6 months with Dr. Waymond Hailey or Katie Jonika Critz,NP. If symptoms do not improve or worsen, please contact office for sooner follow up or seek emergency care.    Allergic rhinoconjunctivitis, seasonal and perennial Improved. Continue follow up with allergy  as scheduled   Advised if symptoms do not improve or worsen, to please contact office for sooner follow up or seek emergency care.   I spent 25 minutes of dedicated to the care of this patient on the date of this encounter to include pre-visit review of records, face-to-face time with the patient discussing conditions above, post visit ordering of testing, clinical documentation with the electronic health record, making appropriate referrals as documented, and communicating necessary findings to members of the patients care team.  Roetta Clarke, NP 06/11/2023  Pt aware and understands NP's role.

## 2023-06-11 NOTE — Patient Instructions (Signed)
 Full PFT performed today.

## 2023-06-11 NOTE — Patient Instructions (Signed)
-  Continue Albuterol inhaler 2 puffs every 6 hours as needed for shortness of breath or wheezing. Notify if symptoms persist despite rescue inhaler/neb use.  -Continue Symbicort  how you have been. Brush tongue and rinse mouth afterwards. I think once you go back to see Dr. Jerelene Monday, if things are still stable, we could try you off the Symbicort   -Continue nasal spray regimen as prescribed by Dr. Jerelene Monday -Continue saline rinses 1-2 times a day with bottled distilled water  as needed. Do this about 20-30 minutes before your sprays -Continue omeprazole   -Continue pepcid    Inflammation in your airways looks better Lung function testing was normal overall. Some mild air trapping, which is normal in setting of asthma    Follow up in 6 months with Dr. Waymond Hailey or Katie Dani Wallner,NP. If symptoms do not improve or worsen, please contact office for sooner follow up or seek emergency care.

## 2023-06-11 NOTE — Assessment & Plan Note (Signed)
 Improved. Continue follow up with allergy  as scheduled

## 2023-06-11 NOTE — Assessment & Plan Note (Signed)
 Cough variant asthma with prior significant elevation in exhaled nitric oxide  testing. Clinically improved with reduced FeNO, indicating improved control. Encouraged him to continue Symbicort  until his follow up with Jose Cervantes and then make a decision on whether to taper off and assess response. Continue trigger prevention. Action plan in place.  Patient Instructions  -Continue Albuterol inhaler 2 puffs every 6 hours as needed for shortness of breath or wheezing. Notify if symptoms persist despite rescue inhaler/neb use.  -Continue Symbicort  how you have been. Brush tongue and rinse mouth afterwards. I think once you go back to see Jose. Jerelene Cervantes, if things are still stable, we could try you off the Symbicort   -Continue nasal spray regimen as prescribed by Jose. Jerelene Cervantes -Continue saline rinses 1-2 times a day with bottled distilled water  as needed. Do this about 20-30 minutes before your sprays -Continue omeprazole   -Continue pepcid    Inflammation in your airways looks better Lung function testing was normal overall. Some mild air trapping, which is normal in setting of asthma    Follow up in 6 months with Jose Cervantes or Jose Lulani Bour,Jose Cervantes. If symptoms do not improve or worsen, please contact office for sooner follow up or seek emergency care.

## 2023-06-11 NOTE — Progress Notes (Signed)
 Full PFT performed today.

## 2023-07-14 ENCOUNTER — Encounter: Payer: Self-pay | Admitting: Allergy and Immunology

## 2023-07-14 ENCOUNTER — Other Ambulatory Visit: Payer: Self-pay

## 2023-07-14 ENCOUNTER — Ambulatory Visit: Admitting: Allergy and Immunology

## 2023-07-14 VITALS — BP 120/68 | HR 77 | Temp 98.0°F | Resp 18 | Ht 68.0 in | Wt 205.2 lb

## 2023-07-14 DIAGNOSIS — R5383 Other fatigue: Secondary | ICD-10-CM | POA: Diagnosis not present

## 2023-07-14 DIAGNOSIS — J4540 Moderate persistent asthma, uncomplicated: Secondary | ICD-10-CM

## 2023-07-14 DIAGNOSIS — K219 Gastro-esophageal reflux disease without esophagitis: Secondary | ICD-10-CM

## 2023-07-14 DIAGNOSIS — J3089 Other allergic rhinitis: Secondary | ICD-10-CM | POA: Diagnosis not present

## 2023-07-14 MED ORDER — FAMOTIDINE 40 MG PO TABS: 40.0000 mg | ORAL_TABLET | Freq: Every evening | ORAL | 1 refills | Status: DC

## 2023-07-14 MED ORDER — AZELASTINE HCL 0.1 % NA SOLN: 1.0000 | Freq: Every day | NASAL | 1 refills | Status: DC | PRN

## 2023-07-14 MED ORDER — FLUTICASONE PROPIONATE 50 MCG/ACT NA SUSP: 1.0000 | Freq: Every day | NASAL | 1 refills | Status: DC

## 2023-07-14 MED ORDER — OMEPRAZOLE 40 MG PO CPDR: 40.0000 mg | DELAYED_RELEASE_CAPSULE | Freq: Two times a day (BID) | ORAL | 1 refills | Status: DC

## 2023-07-14 NOTE — Patient Instructions (Addendum)
  1. Treat and prevent inflammation of airway:   A. Every night use the following:    - OTC Afrin - 1 spray single nostril; alternate nostril nightly   - Fluticasone  - 1 spray each nostril   - Azelastine  - 1 spray each nostril   B. Every morning MAY use the following if needed:   - Fluticasone  - 1 spray each nostril   - Azelastine  - 1 spray each nostril  2. Treat and prevent reflux induced inflammation of airway:   A. Consolidate caffeine consumption  B. Omeprazole  40 mg - 1-2 times per day  C. Famotidine  40 mg - 1 tablet in evening  D. Replace all throat clearing with drinking/swallowing maneuver  3. Discontinue Symbicort   4. Evaluation for fatigue: TSH, FT4, testosterone, a.m. cortisol, (PSA)  5. Return to clinic in 8 weeks. Taper medications???  6. Influenza = Tamiflu. Covid = Paxlovid

## 2023-07-14 NOTE — Progress Notes (Unsigned)
 [place] - [place] - [place] - [place] - [place] [place]   Follow-up Note  Referring Provider: Gustav Lehmann Primary Provider: Gustav Lehmann Date of Office Visit: 07/14/2023  Subjective:   Jose Cervantes (DOB: 01/15/1933) is a 89 y.o. male who [facility] in re-evaluation of the following:  HPI: Jose Cervantes returns to this clinic in evaluation of asthma, rhinitis, LPR.  I last saw him in this clinic 12 May 2023  He continues to do very well.  He has a little bit of throat clearing in the morning but otherwise he is significantly improved regarding all of his laryngeal issues and his cough.  He does not really have any shortness of breath and he can exert himself without any problem.  He does not have any need to use the short acting bronchodilator.  He has not required a systemic steroid or antibiotic for any type of airway issue.  He continues on Symbicort  once a day as well as a collection of Afrin and nasal steroid at nighttime.  He has had no problems with any classic reflux symptoms.  He has fatigue.  He does not really understand why he gets very fatigued in the middle of the day.  He is not really short of breath he does not really have weakness but is just fatigued.  He apparently does have a diagnosis of sleep apnea that was detected many decades ago but has not had any therapy for that issue and according to his wife he does not really snore or have apneic issues at night.   Allergies as of 07/14/2023   No Known Allergies      Medication List    albuterol 108 (90 Base) MCG/ACT inhaler Commonly known as: VENTOLIN HFA Inhale 2 puffs into the lungs every 6 (six) hours as needed for wheezing or shortness of breath.   atorvastatin  10 MG tablet Commonly known as: LIPITOR Take 10 mg by mouth daily.   azelastine  0.1 % nasal spray Commonly known as: ASTELIN  Place 1 spray into both nostrils 2 (two) times daily.    budesonide -formoterol  160-4.5 MCG/ACT inhaler Commonly known as: Symbicort  Inhale 2 puffs into the lungs every morning.   diphenhydrAMINE 25 MG tablet Commonly known as: BENADRYL Take 25 mg by mouth at bedtime.   famotidine  40 MG tablet Commonly known as: PEPCID  Take 1 tablet (40 mg total) by mouth at bedtime.   fluticasone  50 MCG/ACT nasal spray Commonly known as: FLONASE  Place 1 spray into both nostrils 2 (two) times daily.   meloxicam  7.5 MG tablet Commonly known as: MOBIC  Take 7.5 mg by mouth daily.   metFORMIN  500 MG tablet Commonly known as: GLUCOPHAGE  Take 500 mg by mouth daily.   omeprazole  40 MG capsule Commonly known as: PRILOSEC Take 1 capsule (40 mg total) by mouth in the morning and at bedtime.   oxymetazoline  0.05 % nasal spray Commonly known as: Afrin 12 Hour Place 1 spray into both nostrils at bedtime. Alternate nostrils every other night.   Systane 0.4-0.3 % Soln Generic drug: Polyethyl Glycol-Propyl Glycol Place 1 drop into both eyes 3 (three) times daily as needed (dry eyes).    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   Depression    Diabetes mellitus without complication (HCC)    Presence of permanent cardiac  pacemaker    Vision decreased    right eye since birth    Past Surgical History:  Procedure Laterality Date   APPENDECTOMY     BACK SURGERY  [phone], 1984   lumbar laminectomy    CHOLECYSTECTOMY     COLONOSCOPY     EYE SURGERY Left    cataract surgery with lens implant   KNEE ARTHROPLASTY Bilateral    LUMBAR LAMINECTOMY/DECOMPRESSION MICRODISCECTOMY N/A 05/21/2017   Procedure: LAMINECTOMY AND FORAMINOTOMY LUMBAR THREE- LUMBAR FOUR, LUMBAR FOUR- LUMBAR FIVE, LUMBAR FIVE- SACRAL ONE;  Surgeon: Garry Kansas, MD;  Location: [facility] OR;  Service: Neurosurgery;  Laterality: N/A;   MOHS SURGERY Left    ear   PACEMAKER INSERTION     Sept. 2023    TONSILLECTOMY     TOTAL HIP ARTHROPLASTY Left 08/04/2022   Procedure: LEFT TOTAL HIP ARTHROPLASTY ANTERIOR APPROACH;  Surgeon: Liliane Rei, MD;  Location: [facility] ORS;  Service: Orthopedics;  Laterality: Left;   TRANSURETHRAL RESECTION OF PROSTATE      Review of systems negative except as noted in HPI / PMHx or noted below:  Review of Systems  Constitutional: Negative.   HENT: Negative.    Eyes: Negative.   Respiratory: Negative.    Cardiovascular: Negative.   Gastrointestinal: Negative.   Genitourinary: Negative.   Musculoskeletal: Negative.   Skin: Negative.   Neurological: Negative.   Endo/Heme/Allergies: Negative.   Psychiatric/Behavioral: Negative.       Objective:   Vitals:   07/14/23 1013  BP: 120/68  Pulse: 77  Resp: 18  Temp: 98 F (36.7 C)  SpO2: 96%   Height: 5' 8 (172.7 cm)  Weight: 205 lb 3.2 oz (93.1 kg)   Physical Exam Constitutional:      Appearance: He is not diaphoretic.  HENT:     Head: Normocephalic.     Right Ear: Tympanic membrane, ear canal and external ear normal.     Left Ear: Tympanic membrane, ear canal and external ear normal.     Nose: Nose normal. No mucosal edema or rhinorrhea.     Mouth/Throat:     Pharynx: Uvula midline. No oropharyngeal exudate.  Eyes:     Conjunctiva/sclera: Conjunctivae normal.  Neck:     Thyroid: No thyromegaly.     Trachea: Trachea normal. No tracheal tenderness or tracheal deviation.  Cardiovascular:     Rate and Rhythm: Normal rate and regular rhythm.     Heart sounds: Normal heart sounds, S1 normal and S2 normal. No murmur heard. Pulmonary:     Effort: No respiratory distress.     Breath sounds: Normal breath sounds. No stridor. No wheezing or rales.  Lymphadenopathy:     Head:     Right side of head: No tonsillar adenopathy.     Left side of head: No tonsillar adenopathy.     Cervical: No cervical adenopathy.  Skin:    Findings: No erythema or rash.     Nails: There is no clubbing.   Neurological:     Mental Status: He is alert.     Diagnostics: Spirometry was performed and demonstrated an FEV1 of 2.59 at 112 % of predicted.  Results of blood tests obtained 03 Jul 2023 identifies WBC 8.9, hemoglobin 15.9, platelet 188, creatinine 0.75 mg/DL, sodium 244 millimolar/L   Assessment and Plan:   1. Asthma, moderate persistent, well-controlled   2. Perennial allergic rhinitis   3. LPRD (laryngopharyngeal reflux disease)   4. Other fatigue    1. Treat and prevent  inflammation of airway:   A. Every night use the following:    - OTC Afrin - 1 spray single nostril; alternate nostril nightly   - Fluticasone  - 1 spray each nostril   - Azelastine  - 1 spray each nostril   B. Every morning MAY use the following if needed:   - Fluticasone  - 1 spray each nostril   - Azelastine  - 1 spray each nostril  2. Treat and prevent reflux induced inflammation of airway:   A. Consolidate caffeine consumption  B. Omeprazole  40 mg - 1-2 times per day  C. Famotidine  40 mg - 1 tablet in evening  D. Replace all throat clearing with drinking/swallowing maneuver  3. Discontinue Symbicort   4. Evaluation for fatigue: TSH, FT4, testosterone, a.m. cortisol, (PSA)  5. Return to clinic in 8 weeks. Taper medications???  6. Influenza = Tamiflu. Covid = Paxlovid  Kaelen appears to be doing very well regarding his airway and I think there is an opportunity to consolidate some of his treatments.  Will discontinue his Symbicort  and we will keep him on therapy directed against upper airway inflammation and reflux at this point.  He also has pretty significant fatigue and we will perform some screening blood tests in investigation of that issue.  He also would like his PSA checked and we will include that in his blood test.  I will contact him with the results of his blood test once they are available for review.  I will see him back in his clinic in 8 weeks and or may be an opportunity to further  consolidate his medical treatments.  Schuyler Custard, MD Allergy  / Immunology [facility]

## 2023-07-15 ENCOUNTER — Encounter: Payer: Self-pay | Admitting: Allergy and Immunology

## 2023-07-15 LAB — TSH+FREE T4
Free T4: 1.12 ng/dL (ref 0.82–1.77)
TSH: 4.48 u[IU]/mL (ref 0.450–4.500)

## 2023-07-15 LAB — TESTOSTERONE: Testosterone: 258 ng/dL — ABNORMAL LOW (ref 264–916)

## 2023-07-15 LAB — PSA: Prostate Specific Ag, Serum: 1 ng/mL (ref 0.0–4.0)

## 2023-07-15 LAB — CORTISOL-AM, BLOOD: Cortisol - AM: 5.4 ug/dL — ABNORMAL LOW (ref 6.2–19.4)

## 2023-07-20 ENCOUNTER — Ambulatory Visit: Payer: Self-pay | Admitting: Allergy and Immunology

## 2023-07-30 ENCOUNTER — Ambulatory Visit: Payer: Self-pay

## 2023-07-30 NOTE — Telephone Encounter (Signed)
 FYI Only or Action Required?: Action required by provider: request for appointment and update on patient condition.  Patient is followed in Pulmonology for asthma, last seen on 06/11/2023 by Malachy Comer GAILS, NP. Called Nurse Triage reporting Asthma. Symptoms began several weeks ago. Interventions attempted: OTC medications: pepcid , omeprazole , Rescue inhaler, and Maintenance inhaler. Symptoms are: unchanged.  Triage Disposition: Call Specialist Now  Patient/caregiver understands and will follow disposition?: No, wishes to speak with PCP     Copied from CRM [id number]. Topic: Clinical - Red Word Triage >> Jul 30, 2023 10:24 AM Joesph PARAS wrote: Red Word that prompted transfer to Nurse Triage: states symptoms have relapsed, coughing, needing to clear throat often again, wheezing, etc. Last time was asthmatic bronchitis. Reason for Disposition  Oxygen level (e.g., pulse oximetry) 91 to 94 percent  Answer Assessment - Initial Assessment Questions E2C2 Pulmonary Triage - Initial Assessment Questions Chief Complaint (e.g., cough, sob, wheezing, fever, chills, sweat or additional symptoms) *Go to specific symptom protocol after initial questions. Runny nose, coughing, clearing throat, mucus, wheezing - sx have not improved from Dr. Darlean regiment Hx of asthmatic bronchitis by Dr Darlean back in  January  How long have symptoms been present? 2 weeks  Have you tested for COVID or Flu? Note: If not, ask patient if a home test can be taken. If so, instruct patient to call back for positive results. No  MEDICINES:   Have you used any OTC meds to help with symptoms? No If yes, ask What medications? N/a  Have you used your inhalers/maintenance medication? Yes If yes, What medications? Symbicort  - provides some relief Albuterol - provides some relief Omeprazole  pepcid   If inhaler, ask How many puffs and how often? Note: Review instructions on medication in the chart. As prescribed    OXYGEN: Do you wear supplemental oxygen? No If yes, How many liters are you supposed to use? N/a  Do you monitor your oxygen levels? Yes If yes, What is your reading (oxygen level) today? I haven't done it today  What is your usual oxygen saturation reading?  (Note: Pulmonary O2 sats should be 90% or greater) 92-94      1. RESPIRATORY STATUS: Describe your breathing? (e.g., wheezing, shortness of breath, unable to speak, severe coughing)      See a bove 2. ONSET: When did this asthma attack begin?      See above 3. TRIGGER: What do you think triggered this attack? (e.g., URI, exposure to pollen or other allergen, tobacco smoke)      denies 4. PEAK EXPIRATORY FLOW RATE (PEFR): Do you use a peak flow meter? If Yes, ask: What's the current peak flow? What's your personal best peak flow?      N/a 5. SEVERITY: How bad is this attack?    - MILD: No SOB at rest, mild SOB with walking, speaks normally in sentences, can lie down, no retractions, pulse < 100. (GREEN Zone: PEFR 80-100%)   - MODERATE: SOB at rest, SOB with minimal exertion and prefers to sit, cannot lie down flat, speaks in phrases, mild retractions, audible wheezing, pulse 100-120. (YELLOW Zone: PEFR 50-79%)    - SEVERE: Struggling for each breath, speaks in single words, struggling to breathe, sitting hunched forward, retractions, usually loud wheezing, sometimes minimal wheezing because of decreased air movement, pulse > 120. (RED Zone: PEFR < 50%).      Mild - Wheezing with exertion and intermittently Wife reports does not appreciate any wheezing at this time 6. ASTHMA  MEDICINES:  What treatments have you tried?    - INHALED QUICK RELIEF (RESCUE): What is your inhaled quick-relief medicine? (e.g., albuterol, salbutamol) Do you use an inhaler or a nebulizer? How frequently have you been using this medicine?   - CONTROLLER (LONG-TERM-CONTROL): Do you take an inhaled steroid? (e.g., Asmanex,  Flovent , Pulmicort , Qvar)    symbicort  7. INHALED QUICK-RELIEF TREATMENTS FOR THIS ATTACK: What treatments have you given yourself so far? and How many and how often? If using an inhaler, ask, How many puffs? Note: Routine treatments are 2 puffs every 4 hours as needed. Rescue treatments are 4 puffs repeated every 20 minutes, up to three times as needed.      albuterol 8. OTHER SYMPTOMS: Do you have any other symptoms? (e.g., chest pain, coughing up yellow sputum, fever, runny nose)     Runny nose, clear mucus Denies other sx 9. O2 SATURATION MONITOR:  Do you use an oxygen saturation monitor (pulse oximeter) at home? If Yes, What is your reading (oxygen level) today? What is your usual oxygen saturation reading? (e.g., 95%)     See above 10. PREGNANCY: Is there any chance you are pregnant? When was your last menstrual period?       N/a    Of note, wife reports she is a retired Engineer, civil (consulting) and had pt seen by PCP, allergist, cardiologist for sx management, and has not had any sx improvement. Triager attempted to schedule with [facility], but no access. Triager will forward encounter for Dr. Darlean 's office to review and advise. Caregiver verbalized understanding and is expecting call back from office for next steps.  Protocols used: Asthma Attack-A-AH

## 2023-07-30 NOTE — Telephone Encounter (Signed)
 Please advise as patient is experiencing worsening symptoms last OV in 06/2023 states pt was doing well

## 2023-08-03 ENCOUNTER — Ambulatory Visit: Admitting: Internal Medicine

## 2023-08-03 ENCOUNTER — Encounter: Payer: Self-pay | Admitting: Internal Medicine

## 2023-08-03 VITALS — BP 158/82 | HR 71 | Ht 68.0 in | Wt 206.8 lb

## 2023-08-03 DIAGNOSIS — K219 Gastro-esophageal reflux disease without esophagitis: Secondary | ICD-10-CM

## 2023-08-03 DIAGNOSIS — J4541 Moderate persistent asthma with (acute) exacerbation: Secondary | ICD-10-CM | POA: Diagnosis not present

## 2023-08-03 DIAGNOSIS — R7989 Other specified abnormal findings of blood chemistry: Secondary | ICD-10-CM | POA: Diagnosis not present

## 2023-08-03 DIAGNOSIS — R0982 Postnasal drip: Secondary | ICD-10-CM

## 2023-08-03 MED ORDER — METHYLPREDNISOLONE ACETATE 80 MG/ML IJ SUSP
40.0000 mg | Freq: Once | INTRAMUSCULAR | Status: AC
  Administered 2023-08-03: 40 mg via INTRAMUSCULAR

## 2023-08-03 MED ORDER — IPRATROPIUM-ALBUTEROL 0.5-2.5 (3) MG/3ML IN SOLN
3.0000 mL | Freq: Once | RESPIRATORY_TRACT | Status: AC
  Administered 2023-08-03: 3 mL via RESPIRATORY_TRACT

## 2023-08-03 MED ORDER — BUDESONIDE-FORMOTEROL FUMARATE 160-4.5 MCG/ACT IN AERO: 2.0000 | INHALATION_SPRAY | Freq: Two times a day (BID) | RESPIRATORY_TRACT | 12 refills | Status: DC

## 2023-08-03 NOTE — Telephone Encounter (Signed)
 Copied from CRM [id number]. Topic: Clinical - Prescription Issue >> Aug 03, 2023  4:37 PM Leila C wrote: Reason for CRM: Patient's spouse Leonor [phone] states during office visit today 08/03/23, Dr. Meade prescribed Symibort and Prednisone . Patient picked up Symbicort , but Prednisone  was not at [facility]. Patient is suppose to start Prednisone  tomorrow, did Dr. Meade not want patient to take Prednisone ? Per CAL, sent a message to CMA an urgent message. Please advise.   [id number] - [place], [place] - [address] AT [facility] OF [address] & [address] [place] [address] Phone: [phone] Fax: [phone]  Dr.Desai can you please advise the correct dosage of the prednisone  for this patient . Looks like script was not sent into patient's pharmacy.

## 2023-08-03 NOTE — Patient Instructions (Addendum)
 It was a pleasure to see you today!  Please schedule follow up with myself in 3 months.  If my schedule is not open yet, we will contact you with a reminder closer to that time. Please call [phone] if you haven't heard from us  a month before, and always call us  sooner if issues or concerns arise. You can also send us  a message through MyChart, but but aware that this is not to be used for urgent issues and it may take up to 5-7 days to receive a reply. Please be aware that you will likely be able to view your results before I have a chance to respond to them. Please give us  5 business days to respond to any non-urgent results.    YOUR PLAN:  -ASTHMA EXACERBATION: Your asthma symptoms have worsened due to stopping your Symbicort  inhaler too quickly. Asthma is a condition where your airways become inflamed and narrow, making it hard to breathe. We will give you a steroid shot today and prescribe prednisone  for 5 days starting tomorrow. You should resume using Symbicort , 2 puffs twice a day, and gargle after each use. We also provided an in-office breathing treatment. In the future, we may consider reducing your Symbicort  dose once your symptoms are controlled. We will notify Dr. Tiana of your treatment plan.  -ADRENAL INSUFFICIENCY: Your low cortisol levels are likely due to steroid use. Adrenal insufficiency means your adrenal glands are not producing enough cortisol, a hormone important for many body functions.Repeat AM cortisol level per Dr. Rowan recommendation.

## 2023-08-03 NOTE — Progress Notes (Signed)
 Jose Cervantes    [id number]    [birth year]  Primary Care Physician:Davis, Aleck Jose Cervantes Date of Appointment: 08/03/2023 Established Patient Visit  Chief complaint:   Chief Complaint  Patient presents with   Acute Visit    Cough, wheezing and doe. Symptoms started back in June.     HPI: Jose Cervantes is a 89 y.o. never smoker with asthma and upper airway cough syndrome. Peripheral eosinophilia, elevated feno 87 ppb in Feb 2025. Elevated Ige 327 in Jan 2025.    Interval Updates: Discussed the use of AI scribe software for clinical note transcription with the patient, who gave verbal consent to proceed.  History of Present Illness Jose Cervantes is an 89 year old male with asthmatic bronchitis who presents with recurrent coughing and wheezing. He is accompanied by his wife, a Designer, jewellery.  He has been experiencing extreme coughing, similar to symptoms he had in January. At that time, he was treated with various medications and referred for allergy  testing, which returned negative results. He was symptom-free from April until a few days after his last visit on June 10, when his Symbicort  inhaler was discontinued. Since then, his cough has returned and worsened, leading to severe coughing spells.  He describes wheezing, particularly at night, which has been severe enough to keep his wife awake. He also experiences shortness of breath and fatigue, though these symptoms do not prevent him from engaging in activities such as playing golf or doing household chores. His symptoms are more pronounced in the morning and at night, with some relief during the day when he is active.  His current medications include Symbicort , which he has used sporadically since his symptoms returned, and Albuterol, which he uses occasionally, about once a day or sometimes not at all. He also takes Robitussin to manage his cough, particularly at night.  A recent breathing test showed normal  results. However, his symptoms returned a few days after stopping Symbicort . He also reports a low cortisol level identified in recent blood work, which was checked due to his complaints of fatigue.    I have reviewed the patient's family social and past medical history and updated as appropriate.   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   Depression    Diabetes mellitus without complication (HCC)    Presence of permanent cardiac pacemaker    Vision decreased    right eye since birth    Past Surgical History:  Procedure Laterality Date   APPENDECTOMY     BACK SURGERY  [phone], 1984   lumbar laminectomy    CHOLECYSTECTOMY     COLONOSCOPY     EYE SURGERY Left    cataract surgery with lens implant   KNEE ARTHROPLASTY Bilateral    LUMBAR LAMINECTOMY/DECOMPRESSION MICRODISCECTOMY N/A 05/21/2017   Procedure: LAMINECTOMY AND FORAMINOTOMY LUMBAR THREE- LUMBAR FOUR, LUMBAR FOUR- LUMBAR FIVE, LUMBAR FIVE- SACRAL ONE;  Surgeon: Mavis Purchase, MD;  Location: [facility] OR;  Service: Neurosurgery;  Laterality: N/A;   MOHS SURGERY Left    ear   PACEMAKER INSERTION     Sept. 2023   TONSILLECTOMY     TOTAL HIP ARTHROPLASTY Left 08/04/2022   Procedure: LEFT TOTAL HIP ARTHROPLASTY ANTERIOR APPROACH;  Surgeon: Melodi Lerner, MD;  Location: [facility] ORS;  Service: Orthopedics;  Laterality: Left;  TRANSURETHRAL RESECTION OF PROSTATE      Family History  Problem Relation Age of Onset   Diabetes Mellitus II Mother    Hypertension Mother    Congestive Heart Failure Father    Heart disease Father    Breast cancer Sister    Bone cancer Sister     Social History   Occupational History   Not on file  Tobacco Use   Smoking status: Never   Smokeless tobacco: Never  Vaping Use   Vaping status: Never Used  Substance and Sexual Activity   Alcohol use: Yes    Alcohol/week: 2.0 standard drinks of alcohol    Types: 2 Shots  of liquor per week    Comment: weekly   Drug use: Never   Sexual activity: Not on file     Physical Exam: Blood pressure (!) 158/82, pulse 71, height 5' 8 (1.727 m), weight 206 lb 12.8 oz (93.8 kg), SpO2 96%.  Gen:      No acute distress, cough ENT:  no nasal polyps, mucus membranes moist Lungs:   diminished bilaterally, coarse breath sounds with end expiratory wheeze CV:         Regular rate and rhythm; no murmurs, rubs, or gallops.  No pedal edema   Data Reviewed: Imaging: I have personally reviewed the chest xray 2019 no acute process  PFTs:     Latest Ref Rng & Units 06/11/2023    8:56 AM  PFT Results  FVC-Pre L 3.47   FVC-Predicted Pre % 109   FVC-Post L 3.55   FVC-Predicted Post % 111   Pre FEV1/FVC % % 79   Post FEV1/FCV % % 80   FEV1-Pre L 2.76   FEV1-Predicted Pre % 127   FEV1-Post L 2.85   DLCO uncorrected ml/min/mmHg 21.23   DLCO UNC% % 99   DLCO corrected ml/min/mmHg 21.23   DLCO COR %Predicted % 99   DLVA Predicted % 90   TLC L 6.87   TLC % Predicted % 106   RV % Predicted % 122    I have personally reviewed the patient's PFTs and normal spirometry and dlco with evidence of air trapping  Labs: As reviewed above - elevated feno, ige, eosinophils  Immunization status: Immunization History  Administered Date(s) Administered   PFIZER(Purple Top)SARS-COV-2 Vaccination 02/12/2019, 03/05/2019, 01/12/2020    External Records Personally Reviewed: pulmonary, allergy   Assessment and Plan Assessment & Plan Moderate persistent asthma with acute exacerbation Exacerbation due to abrupt Symbicort  discontinuation. Allergic inflammation confirmed by exhaled nitric oxide  and eosinophilia. Discussed inhaled vs. oral steroids benefits. - Administer steroid shot today. - Prescribe prednisone  for 5 days starting tomorrow. - Resume Symbicort , 2 puffs BID. - Provide in-office breathing treatment. - Instruct to gargle after Symbicort  use. - Consider future  Symbicort  dose reduction when controlled. - Notify Dr. Tiana of treatment plan.  Adrenal insufficiency Low cortisol levels likely from steroid use. Explained steroid impact on cortisol production. - Repeat morning cortisol test.     Return to Care: Return in about 3 months (around 11/03/2023).   Verdon Gore, MD Pulmonary and Critical Care Medicine [facility] [phone]

## 2023-08-04 ENCOUNTER — Other Ambulatory Visit (HOSPITAL_BASED_OUTPATIENT_CLINIC_OR_DEPARTMENT_OTHER): Payer: Self-pay

## 2023-08-04 ENCOUNTER — Telehealth: Payer: Self-pay

## 2023-08-04 MED ORDER — PREDNISONE 20 MG PO TABS: ORAL_TABLET | ORAL | 0 refills | Status: DC

## 2023-08-04 MED ORDER — PREDNISONE 20 MG PO TABS
ORAL_TABLET | ORAL | 0 refills | Status: DC
Start: 2023-08-04 — End: 2023-08-04

## 2023-08-04 NOTE — Telephone Encounter (Signed)
 Copied from CRM [id number]. Topic: Clinical - Prescription Issue >> Aug 04, 2023 11:01 AM Rilla NOVAK wrote: Reason for CRM: Patient's wife calling regarding status prednisone . Does Dr Meade still plan for him to start it or did she change her mind?  Please call.  Duplicate.

## 2023-08-04 NOTE — Telephone Encounter (Signed)
 Rx sent to pharmacy unable to notify pt as line is busy

## 2023-08-04 NOTE — Telephone Encounter (Signed)
 Copied from CRM [id number]. Topic: Clinical - Prescription Issue >> Aug 04, 2023 11:01 AM Rilla NOVAK wrote: Reason for CRM: Patient's wife calling regarding status prednisone . Does Dr Meade still plan for him to start it or did she change her mind?  Please call.  Patients wife calling again. Waiting for Dr. Correne response.

## 2023-08-04 NOTE — Addendum Note (Signed)
 Addended by: TRUDY WARREN CROME on: 08/04/2023 03:08 PM   Modules accepted: Orders

## 2023-08-04 NOTE — Telephone Encounter (Signed)
 Pt spouse notified DPR

## 2023-09-15 ENCOUNTER — Encounter: Payer: Self-pay | Admitting: Allergy and Immunology

## 2023-09-15 ENCOUNTER — Other Ambulatory Visit: Payer: Self-pay

## 2023-09-15 ENCOUNTER — Ambulatory Visit: Admitting: Allergy and Immunology

## 2023-09-15 VITALS — BP 102/60 | HR 72 | Temp 97.7°F | Resp 18 | Ht 66.0 in | Wt 204.4 lb

## 2023-09-15 DIAGNOSIS — K219 Gastro-esophageal reflux disease without esophagitis: Secondary | ICD-10-CM | POA: Diagnosis not present

## 2023-09-15 DIAGNOSIS — R5383 Other fatigue: Secondary | ICD-10-CM

## 2023-09-15 DIAGNOSIS — J3089 Other allergic rhinitis: Secondary | ICD-10-CM | POA: Diagnosis not present

## 2023-09-15 DIAGNOSIS — J4540 Moderate persistent asthma, uncomplicated: Secondary | ICD-10-CM | POA: Diagnosis not present

## 2023-09-15 DIAGNOSIS — J4541 Moderate persistent asthma with (acute) exacerbation: Secondary | ICD-10-CM

## 2023-09-15 DIAGNOSIS — R7989 Other specified abnormal findings of blood chemistry: Secondary | ICD-10-CM

## 2023-09-15 MED ORDER — BUDESONIDE-FORMOTEROL FUMARATE 160-4.5 MCG/ACT IN AERO: 2.0000 | INHALATION_SPRAY | Freq: Two times a day (BID) | RESPIRATORY_TRACT | 1 refills | Status: DC

## 2023-09-15 MED ORDER — OXYMETAZOLINE HCL 0.05 % NA SOLN: 1.0000 | Freq: Every evening | NASAL | 1 refills | Status: AC

## 2023-09-15 MED ORDER — FLUTICASONE PROPIONATE 50 MCG/ACT NA SUSP: 1.0000 | Freq: Every day | NASAL | 1 refills | Status: DC

## 2023-09-15 MED ORDER — AZELASTINE HCL 0.1 % NA SOLN: 1.0000 | Freq: Every day | NASAL | 1 refills | Status: DC | PRN

## 2023-09-15 NOTE — Progress Notes (Signed)
 [place] - [place] - [place] - [place] - [place]   Follow-up Note  Referring Provider: Nicholaus Aleck DELENA DEVONNA Primary Provider: Nicholaus Aleck DELENA DEVONNA (Inactive) Date of Office Visit: 09/15/2023  Subjective:   Jose Cervantes (DOB: August 19, 1932) is a 89 y.o. male who [facility] in re-evaluation of the following:  HPI: Jose Cervantes returns to this clinic in evaluation of asthma, rhinitis, LPR, fatigue.  I last saw him in this clinic 14 July 2023.  When I last saw him in his clinic he appeared to have his respiratory tract issue under excellent control and we recommended that he taper off his Symbicort  but unfortunately he developed an exacerbation while doing so and require evaluation with Dr. Meade on 03 August 2023 who placed him back on Symbicort  in addition to giving him an injection of steroids and prednisone  for 5 days.  Currently, he is doing great with his respiratory track and he has no issues at all with coughing or shortness of breath and most of his throat issues is under very good control at this point and his nose is doing well.  He has tapered down his nasal sprays and he only uses his nasal sprays about 3 times per week in the evening with a combination of Afrin, nasal steroid, nasal antihistamine.  He has no classic reflux symptoms while utilizing his proton pump inhibitor and H2 receptor blocker.  He has no issues with his throat at this point.  He still fatigued.  Allergies as of 09/15/2023   No Known Allergies      Medication List    albuterol  108 (90 Base) MCG/ACT inhaler Commonly known as: VENTOLIN  HFA Inhale 2 puffs into the lungs every 6 (six) hours as needed for wheezing or shortness of breath.   atorvastatin  10 MG tablet Commonly known as: LIPITOR Take 10 mg by mouth daily.   azelastine  0.1 % nasal spray Commonly known as: ASTELIN  Place 1 spray into both nostrils daily as needed for rhinitis.    budesonide -formoterol  160-4.5 MCG/ACT inhaler Commonly known as: Symbicort  Inhale 2 puffs into the lungs in the morning and at bedtime.   diphenhydrAMINE 25 MG tablet Commonly known as: BENADRYL Take 25 mg by mouth at bedtime.   famotidine  40 MG tablet Commonly known as: PEPCID  Take 1 tablet (40 mg total) by mouth at bedtime.   fluticasone  50 MCG/ACT nasal spray Commonly known as: FLONASE  Place 1 spray into both nostrils daily.   meloxicam  7.5 MG tablet Commonly known as: MOBIC  Take 7.5 mg by mouth daily.   metFORMIN  500 MG tablet Commonly known as: GLUCOPHAGE  Take 500 mg by mouth daily.   omeprazole  40 MG capsule Commonly known as: PRILOSEC Take 1 capsule (40 mg total) by mouth in the morning and at bedtime.   oxymetazoline  0.05 % nasal spray Commonly known as: Afrin 12 Hour Place 1 spray into both nostrils at bedtime. Alternate nostrils every other night.   predniSONE  20 MG tablet Commonly known as: DELTASONE  Take 2 tabs daily with food x 5 days   Systane 0.4-0.3 % Soln Generic drug: Polyethyl Glycol-Propyl Glycol Place 1 drop into both eyes 3 (three) times daily as needed (dry eyes).    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   Depression    Diabetes mellitus without complication (HCC)  Presence of permanent cardiac pacemaker    Vision decreased    right eye since birth    Past Surgical History:  Procedure Laterality Date   APPENDECTOMY     BACK SURGERY  [id number], 1984   lumbar laminectomy    CHOLECYSTECTOMY     COLONOSCOPY     EYE SURGERY Left    cataract surgery with lens implant   KNEE ARTHROPLASTY Bilateral    LUMBAR LAMINECTOMY/DECOMPRESSION MICRODISCECTOMY N/A 05/21/2017   Procedure: LAMINECTOMY AND FORAMINOTOMY LUMBAR THREE- LUMBAR FOUR, LUMBAR FOUR- LUMBAR FIVE, LUMBAR FIVE- SACRAL ONE;  Surgeon: Mavis Purchase, MD;  Location: [facility] OR;  Service: Neurosurgery;   Laterality: N/A;   MOHS SURGERY Left    ear   PACEMAKER INSERTION     Sept. 2023   TONSILLECTOMY     TOTAL HIP ARTHROPLASTY Left 08/04/2022   Procedure: LEFT TOTAL HIP ARTHROPLASTY ANTERIOR APPROACH;  Surgeon: Melodi Lerner, MD;  Location: [facility];  Service: Orthopedics;  Laterality: Left;   TRANSURETHRAL RESECTION OF PROSTATE      Review of systems negative except as noted in HPI / PMHx or noted below:  Review of Systems  Constitutional: Negative.   HENT: Negative.    Eyes: Negative.   Respiratory: Negative.    Cardiovascular: Negative.   Gastrointestinal: Negative.   Genitourinary: Negative.   Musculoskeletal: Negative.   Skin: Negative.   Neurological: Negative.   Endo/Heme/Allergies: Negative.   Psychiatric/Behavioral: Negative.       Objective:   Vitals:   09/15/23 1055  BP: 102/60  Pulse: 72  Resp: 18  Temp: 97.7 F (36.5 C)  SpO2: 97%   Height: 5' 6 (167.6 cm)  Weight: 204 lb 6.4 oz (92.7 kg)   Physical Exam Constitutional:      Appearance: He is not diaphoretic.  HENT:     Head: Normocephalic.     Right Ear: Tympanic membrane, ear canal and external ear normal.     Left Ear: Tympanic membrane, ear canal and external ear normal.     Nose: Nose normal. No mucosal edema or rhinorrhea.     Mouth/Throat:     Pharynx: Uvula midline. No oropharyngeal exudate.  Eyes:     Conjunctiva/sclera: Conjunctivae normal.  Neck:     Thyroid: No thyromegaly.     Trachea: Trachea normal. No tracheal tenderness or tracheal deviation.  Cardiovascular:     Rate and Rhythm: Normal rate and regular rhythm.     Heart sounds: Normal heart sounds, S1 normal and S2 normal. No murmur heard. Pulmonary:     Effort: No respiratory distress.     Breath sounds: Normal breath sounds. No stridor. No wheezing or rales.  Lymphadenopathy:     Head:     Right side of head: No tonsillar adenopathy.     Left side of head: No tonsillar adenopathy.     Cervical: No cervical adenopathy.   Skin:    Findings: No erythema or rash.     Nails: There is no clubbing.  Neurological:     Mental Status: He is alert.     Diagnostics:    Spirometry was performed and demonstrated an FEV1 of 2.30 at 100 % of predicted.  Results of blood tests obtained 14 July 2023 identifies testosterone  258 UG/DL, TSH 5.519 IU/ML, free T41.19 NG/DL, cortisol 5.4 UG/DL, PSA 1.0 NG/mL  Assessment and Plan:   1. Asthma, moderate persistent, well-controlled   2. Perennial allergic rhinitis   3. LPRD (laryngopharyngeal reflux disease)   4.  Other fatigue   5. Low testosterone  in male   6. Low serum cortisol level   7. Moderate persistent asthmatic bronchitis with acute exacerbation    1. Treat and prevent inflammation of airway:   A. Every night use the following:    - OTC Afrin - 1 spray single nostril; alternate nostril nightly   - Fluticasone  - 1 spray each nostril   - Azelastine  - 1 spray each nostril   B. Every morning MAY use the following if needed:   - Fluticasone  - 1 spray each nostril   - Azelastine  - 1 spray each nostril   C. Symbicort  160 - 2 inhalations 2 times per day  2. Treat and prevent reflux induced inflammation of airway:   A. Consolidate caffeine consumption  B. Omeprazole  40 mg - 1-2 times per day  C. Famotidine  40 mg - 1 tablet in evening  D. Replace all throat clearing with drinking/swallowing maneuver  3. If needed:   A. Symbicort  160 - 2 inhalations every 6 hours (replaces albuterol )  4. Blood - AM cortisol at 9AM  5. Discuss with primary doctor about low dose testosterone  supplementation  6. Influenza = Tamiflu. Covid = Paxlovid  7. Return to clinic in December 2025 or earlier if problem  Jose Cervantes is doing very well on his current plan which includes using some nasal sprays about 3 times per week in the evening which includes Afrin/fluticasone /azelastine , consistent use of Symbicort , and aggressive therapy directed against LPR.  At this point were not  really going to change any of his therapy.  He does have fatigue and he has a low testosterone  level and he can discuss that issue with his primary care doctor.  He had a low cortisol level as a random collection and will now check an a.m. cortisol and if it is very low we will refer him on to endocrinology for further evaluation of that issue.  Camellia Denis, MD Allergy  / Immunology [facility]

## 2023-09-15 NOTE — Patient Instructions (Addendum)
  1. Treat and prevent inflammation of airway:   A. Every night use the following:    - OTC Afrin - 1 spray single nostril; alternate nostril nightly   - Fluticasone - 1 spray each nostril   - Azelastine - 1 spray each nostril   B. Every morning MAY use the following if needed:   - Fluticasone - 1 spray each nostril   - Azelastine - 1 spray each nostril   C. Symbicort 160 - 2 inhalations 2 times per day  2. Treat and prevent reflux induced inflammation of airway:   A. Consolidate caffeine consumption  B. Omeprazole 40 mg - 1-2 times per day  C. Famotidine 40 mg - 1 tablet in evening  D. Replace all throat clearing with drinking/swallowing maneuver  3. If needed:   A. Symbicort 160 - 2 inhalations every 6 hours (replaces albuterol)  4. Blood - AM cortisol at 9AM  5. Discuss with primary doctor about low dose testosterone supplementation  6. Influenza = Tamiflu. Covid = Paxlovid  7. Return to clinic in December 2025 or earlier if problem

## 2023-11-09 ENCOUNTER — Ambulatory Visit: Admitting: Internal Medicine

## 2023-11-10 ENCOUNTER — Ambulatory Visit: Admitting: Internal Medicine

## 2023-11-16 ENCOUNTER — Ambulatory Visit: Admitting: Internal Medicine

## 2023-11-16 ENCOUNTER — Encounter: Payer: Self-pay | Admitting: Internal Medicine

## 2023-11-16 VITALS — BP 124/60 | HR 70 | Temp 97.5°F | Ht 68.0 in | Wt 209.0 lb

## 2023-11-16 DIAGNOSIS — J301 Allergic rhinitis due to pollen: Secondary | ICD-10-CM | POA: Diagnosis not present

## 2023-11-16 DIAGNOSIS — K219 Gastro-esophageal reflux disease without esophagitis: Secondary | ICD-10-CM | POA: Diagnosis not present

## 2023-11-16 DIAGNOSIS — J4541 Moderate persistent asthma with (acute) exacerbation: Secondary | ICD-10-CM | POA: Diagnosis not present

## 2023-11-16 NOTE — Progress Notes (Signed)
 Jose Cervantes    [id number]    Dec 21, 1932  Primary Care Physician:Davis, Aleck DELENA, PA-C (Inactive) Date of Appointment: 11/16/2023 Established Patient Visit  Chief complaint:   Chief Complaint  Patient presents with   Asthma    Act score (19) Patient states his asthma is completely controlled.     HPI: Jose Cervantes is a 89 y.o. man with moderate persistent asthma.   Allergy  - Dr. Maurilio  Interval Updates:  No interval exacerbations.   He takes omeprazole  before breakfast.   He takes nasal sprays as needed not daily.   Currently taking symbicort  1-2 puffs a day. Wants to know if he can stop this again.   Takes benadryl at night to help him sleep.   Takes pepcid  at night as well. Wants to come off some of these medications.   Wife notes early morning mucus production, throat clearing, coughing.  He wants to see if he can stop some of the medications he is on.    Current Regimen: symbicort  2 puffs bid Asthma Triggers: allergies, PND Exacerbations in the last year: 1 History of hospitalization or intubation: one Allergy  Testing/rhinitis: yes GERD: yes chronic LPR ACT:  Asthma Control Test ACT Total Score  11/16/2023  9:15 AM 19  08/03/2023  1:20 PM 14   FeNO: 35 ppb in may 2025 Serum Eos/IgE: aec 600 in jan 2025  I have reviewed the patient's family social and past medical history and updated as appropriate.   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   Depression    Diabetes mellitus without complication (HCC)    Presence of permanent cardiac pacemaker    Vision decreased    right eye since birth    Past Surgical History:  Procedure Laterality Date   APPENDECTOMY     BACK SURGERY  [phone], 1984   lumbar laminectomy    CHOLECYSTECTOMY     COLONOSCOPY     EYE SURGERY Left    cataract surgery with lens implant   KNEE ARTHROPLASTY Bilateral    LUMBAR  LAMINECTOMY/DECOMPRESSION MICRODISCECTOMY N/A 05/21/2017   Procedure: LAMINECTOMY AND FORAMINOTOMY LUMBAR THREE- LUMBAR FOUR, LUMBAR FOUR- LUMBAR FIVE, LUMBAR FIVE- SACRAL ONE;  Surgeon: Mavis Purchase, MD;  Location: [facility];  Service: Neurosurgery;  Laterality: N/A;   MOHS SURGERY Left    ear   PACEMAKER INSERTION     Sept. 2023   TONSILLECTOMY     TOTAL HIP ARTHROPLASTY Left 08/04/2022   Procedure: LEFT TOTAL HIP ARTHROPLASTY ANTERIOR APPROACH;  Surgeon: Melodi Lerner, MD;  Location: [facility] ORS;  Service: Orthopedics;  Laterality: Left;   TRANSURETHRAL RESECTION OF PROSTATE      Family History  Problem Relation Age of Onset   Diabetes Mellitus II Mother    Hypertension Mother    Congestive Heart Failure Father    Heart disease Father    Breast cancer Sister    Bone cancer Sister     Social History   Occupational History   Not on file  Tobacco Use   Smoking status: Never   Smokeless tobacco: Never  Vaping Use   Vaping status: Never Used  Substance and Sexual Activity   Alcohol use: Yes    Alcohol/week: 2.0 standard drinks of alcohol    Types: 2 Shots of liquor per week  Comment: weekly   Drug use: Never   Sexual activity: Not on file     Physical Exam: Blood pressure 124/60, pulse 70, temperature (!) 97.5 F (36.4 C), temperature source Oral, height 5' 8 (1.727 m), weight 209 lb (94.8 kg), peak flow 97 L/min.  Gen:      No acute distress ENT:  no nasal polyps, mucus membranes moist Lungs:    No increased respiratory effort, symmetric chest wall excursion, clear to auscultation bilaterally, no wheezes or crackles CV:         Regular rate and rhythm; no murmurs, rubs, or gallops.  No pedal edema   Data Reviewed: Imaging: I have personally reviewed the chest xray jan 2025 - no acute cardiopulmonary proess  PFTs:     Latest Ref Rng & Units 06/11/2023    8:56 AM  PFT Results  FVC-Pre L 3.47   FVC-Predicted Pre % 109   FVC-Post L 3.55   FVC-Predicted Post % 111    Pre FEV1/FVC % % 79   Post FEV1/FCV % % 80   FEV1-Pre L 2.76   FEV1-Predicted Pre % 127   FEV1-Post L 2.85   DLCO uncorrected ml/min/mmHg 21.23   DLCO UNC% % 99   DLCO corrected ml/min/mmHg 21.23   DLCO COR %Predicted % 99   DLVA Predicted % 90   TLC L 6.87   TLC % Predicted % 106   RV % Predicted % 122    I have personally reviewed the patient's PFTs and no airflow limitation. Normal ptft  Labs: Lab Results  Component Value Date   NA 134 (L) 08/05/2022   K 3.8 08/05/2022   CO2 22 08/05/2022   GLUCOSE 170 (H) 08/05/2022   BUN 17 08/05/2022   CREATININE 0.95 08/05/2022   CALCIUM  8.3 (L) 08/05/2022   GFRNONAA >60 08/05/2022   Lab Results  Component Value Date   WBC 8.1 02/23/2023   HGB 15.1 02/23/2023   HCT 44.7 02/23/2023   MCV 95.3 02/23/2023   PLT 145.0 (L) 02/23/2023     Immunization status: Immunization History  Administered Date(s) Administered   PFIZER(Purple Top)SARS-COV-2 Vaccination 02/12/2019, 03/05/2019, 01/12/2020    External Records Personally Reviewed: allergy  and asthma  Assessment:  Moderate persistent asthma, controlled   Plan/Recommendations:  Continue the symbicort  1 puff once daily. If your asthma symptoms worsen with chest tightness, wheezing, shortness of breath, coughing then I recommend going up to 2 puffs twice daily, gargle after use.  Start using the nasal sprays again if your allergy  symptoms postnasal drainage get worse.  I understand you wanted to come down on the reflux medications.  I recommend coming down on the slowly.  You can stop the Pepcid  first at night.  Wait a week and if no worsening of your reflux or coughing symptoms you can stop the omeprazole .  Possibly may need to go back up on these medications if the coughing or asthma symptoms worsen.     Return to Care: Return in about 6 months (around 05/16/2024) for Adrien Helling, Hattar.   Verdon Gore, MD Pulmonary and Critical Care Medicine [facility] Office:[phone]

## 2023-11-16 NOTE — Patient Instructions (Signed)
 It was a pleasure to see you today!  Please schedule follow up with us  in 6 months.  If my schedule is not open yet, we will contact you with a reminder closer to that time. Please call [phone] if you haven't heard from us  a month before, and always call us  sooner if issues or concerns arise. You can also send us  a message through MyChart, but but aware that this is not to be used for urgent issues and it may take up to 5-7 days to receive a reply. Please be aware that you will likely be able to view your results before I have a chance to respond to them. Please give us  5 business days to respond to any non-urgent results.     Continue the symbicort  1 puff once daily. If your asthma symptoms worsen with chest tightness, wheezing, shortness of breath, coughing then I recommend going up to 2 puffs twice daily, gargle after use.  Start using the nasal sprays again if your allergy  symptoms postnasal drainage get worse.  I understand you wanted to come down on the reflux medications.  I recommend coming down on the slowly.  You can stop the Pepcid  first at night.  Wait a week and if no worsening of your reflux or coughing symptoms you can stop the omeprazole .  Possibly may need to go back up on these medications if the coughing or asthma symptoms worsen.

## 2024-01-05 ENCOUNTER — Ambulatory Visit: Payer: Self-pay | Admitting: Allergy and Immunology

## 2024-01-05 LAB — CORTISOL-AM, BLOOD: Cortisol - AM: 3.7 ug/dL — ABNORMAL LOW (ref 6.2–19.4)

## 2024-01-12 ENCOUNTER — Other Ambulatory Visit: Payer: Self-pay

## 2024-01-12 ENCOUNTER — Encounter: Payer: Self-pay | Admitting: Allergy and Immunology

## 2024-01-12 ENCOUNTER — Ambulatory Visit: Admitting: Allergy and Immunology

## 2024-01-12 VITALS — BP 124/78 | HR 80 | Resp 16 | Ht 66.5 in | Wt 204.0 lb

## 2024-01-12 DIAGNOSIS — J988 Other specified respiratory disorders: Secondary | ICD-10-CM | POA: Diagnosis not present

## 2024-01-12 DIAGNOSIS — J4540 Moderate persistent asthma, uncomplicated: Secondary | ICD-10-CM | POA: Diagnosis not present

## 2024-01-12 DIAGNOSIS — R5383 Other fatigue: Secondary | ICD-10-CM | POA: Diagnosis not present

## 2024-01-12 DIAGNOSIS — B9789 Other viral agents as the cause of diseases classified elsewhere: Secondary | ICD-10-CM | POA: Diagnosis not present

## 2024-01-12 DIAGNOSIS — R7989 Other specified abnormal findings of blood chemistry: Secondary | ICD-10-CM

## 2024-01-12 DIAGNOSIS — J3089 Other allergic rhinitis: Secondary | ICD-10-CM

## 2024-01-12 DIAGNOSIS — K219 Gastro-esophageal reflux disease without esophagitis: Secondary | ICD-10-CM | POA: Diagnosis not present

## 2024-01-12 MED ORDER — FAMOTIDINE 40 MG PO TABS: 40.0000 mg | ORAL_TABLET | Freq: Every evening | ORAL | 1 refills | Status: AC

## 2024-01-12 MED ORDER — BUDESONIDE-FORMOTEROL FUMARATE 160-4.5 MCG/ACT IN AERO: 2.0000 | INHALATION_SPRAY | Freq: Two times a day (BID) | RESPIRATORY_TRACT | 1 refills | Status: AC

## 2024-01-12 MED ORDER — OMEPRAZOLE 40 MG PO CPDR: 40.0000 mg | DELAYED_RELEASE_CAPSULE | Freq: Two times a day (BID) | ORAL | 1 refills | Status: AC

## 2024-01-12 MED ORDER — AZELASTINE HCL 0.1 % NA SOLN: 1.0000 | Freq: Every day | NASAL | 1 refills | Status: AC | PRN

## 2024-01-12 MED ORDER — FLUTICASONE PROPIONATE 50 MCG/ACT NA SUSP: 1.0000 | Freq: Every day | NASAL | 1 refills | Status: AC

## 2024-01-12 NOTE — Patient Instructions (Addendum)
  1.  Restart treatment and prevention of airway inflammation:   A. Every night use the following:    - OTC Afrin - 1 spray single nostril; alternate nostril nightly   - Fluticasone  - 1 spray each nostril   - Azelastine  - 1 spray each nostril   B. Every morning use the following :   - Fluticasone  - 1 spray each nostril   - Azelastine  - 1 spray each nostril   C. Symbicort  160 - 2 inhalations 2 times per day  2. Restart treatment and prevention of reflux induced inflammation of airway:   A. Consolidate caffeine consumption  B. Omeprazole  40 mg - 1-2 times per day  C. Famotidine  40 mg - 1 tablet in evening  D. Replace all throat clearing with drinking/swallowing maneuver  3. If needed:   A. Symbicort  160 - 2 inhalations every 6 hours (replaces albuterol )  4. Needs appointment with endocrinology for low testosterone  and cortisol  5. Influenza = Tamiflu. Covid = Paxlovid  6. Return to clinic in 6 months or earlier if problem

## 2024-01-12 NOTE — Progress Notes (Unsigned)
 Jose Cervantes - [place] - [place] - [place]   Follow-up Note  Referring Provider: No ref. provider found Primary Provider: Nicholaus Aleck LABOR, PA-C (Inactive) Date of Office Visit: 01/12/2024  Subjective:   Jose Cervantes (DOB: 07-04-1932) is a 89 y.o. male who [facility] in re-evaluation of the following:  HPI: [name] returns to this clinic in evaluation of asthma, rhinitis, LPR, fatigue.  I last saw him in his clinic 15 September 2023.  He slowly tapered off most of his medications other than Symbicort  and thought that he was doing very well.  He had very little problems with his nose and very little problems with his throat and no problems with asthma and did not require systemic steroid or an antibiotic.  But unfortunately, his viral infected son visited him a Christmas holiday and gave [name] a very bad head cold with head fullness and throat pain and coughing for which he went to the urgent care center on 01 April 2024 and received a shot of steroids and prednisone .  He is a lot better at this point in time but still has a little bit of a lingering cough.  He did restart all of his medications including treatment for upper airway inflammation and reflux.  Still continues on Symbicort .  He still has lots of fatigue.  We have documented that he has low testosterone  and we documented that he had a low random cortisol level.  He had a repeat of his cortisol level last week which was also low but it should be noted that that most recent cortisol evaluation occurred in the context of him receiving a systemic steroid from the urgent care center with his viral respiratory tract infection just a few days prior to that collection.  He is still very fatigued and washed out through the day.  Allergies as of 01/12/2024   No Known Allergies      Medication List    albuterol  108 (90 Base) MCG/ACT inhaler Commonly known as: VENTOLIN   HFA Inhale 2 puffs into the lungs every 6 (six) hours as needed for wheezing or shortness of breath.   atorvastatin  10 MG tablet Commonly known as: LIPITOR Take 10 mg by mouth daily.   azelastine  0.1 % nasal spray Commonly known as: ASTELIN  Place 1 spray into both nostrils daily as needed for rhinitis.   budesonide -formoterol  160-4.5 MCG/ACT inhaler Commonly known as: Symbicort  Inhale 2 puffs into the lungs in the morning and at bedtime.   diphenhydrAMINE 25 MG tablet Commonly known as: BENADRYL Take 25 mg by mouth at bedtime.   famotidine  40 MG tablet Commonly known as: PEPCID  Take 1 tablet (40 mg total) by mouth at bedtime.   fluticasone  50 MCG/ACT nasal spray Commonly known as: FLONASE  Place 1 spray into both nostrils daily.   meloxicam  7.5 MG tablet Commonly known as: MOBIC  Take 7.5 mg by mouth daily.   metFORMIN  500 MG tablet Commonly known as: GLUCOPHAGE  Take 500 mg by mouth daily.   omeprazole  40 MG capsule Commonly known as: PRILOSEC Take 1 capsule (40 mg total) by mouth in the morning and at bedtime.   oxymetazoline  0.05 % nasal spray Commonly known as: Afrin 12 Hour Place 1 spray into both nostrils at bedtime. Alternate nostrils every other night.   Systane 0.4-0.3 % Soln Generic drug: Polyethyl Glycol-Propyl Glycol Place 1 drop into both eyes 3 (three) times daily as needed (dry eyes).    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   Depression    Diabetes mellitus without complication (HCC)    Presence of permanent cardiac pacemaker    Vision decreased    right eye since birth    Past Surgical History:  Procedure Laterality Date   APPENDECTOMY     BACK SURGERY  [phone], 1984   lumbar laminectomy    CHOLECYSTECTOMY     COLONOSCOPY     EYE SURGERY Left    cataract surgery with lens implant   KNEE ARTHROPLASTY Bilateral    LUMBAR LAMINECTOMY/DECOMPRESSION  MICRODISCECTOMY N/A 05/21/2017   Procedure: LAMINECTOMY AND FORAMINOTOMY LUMBAR THREE- LUMBAR FOUR, LUMBAR FOUR- LUMBAR FIVE, LUMBAR FIVE- SACRAL ONE;  Surgeon: Mavis Purchase, MD;  Location: [facility] OR;  Service: Neurosurgery;  Laterality: N/A;   MOHS SURGERY Left    ear   PACEMAKER INSERTION     Sept. 2023   TONSILLECTOMY     TOTAL HIP ARTHROPLASTY Left 08/04/2022   Procedure: LEFT TOTAL HIP ARTHROPLASTY ANTERIOR APPROACH;  Surgeon: Melodi Lerner, MD;  Location: [facility];  Service: Orthopedics;  Laterality: Left;   TRANSURETHRAL RESECTION OF PROSTATE      Review of systems negative except as noted in HPI / PMHx or noted below:  Review of Systems  Constitutional: Negative.   HENT: Negative.    Eyes: Negative.   Respiratory: Negative.    Cardiovascular: Negative.   Gastrointestinal: Negative.   Genitourinary: Negative.   Musculoskeletal: Negative.   Skin: Negative.   Neurological: Negative.   Endo/Heme/Allergies: Negative.   Psychiatric/Behavioral: Negative.       Objective:   Vitals:   01/12/24 1041  BP: 124/78  Pulse: 80  Resp: 16  SpO2: 96%   Height: 5' 6.5 (168.9 cm)  Weight: 204 lb (92.5 kg)   Physical Exam Constitutional:      Appearance: He is not diaphoretic.     Comments: Throat clearing  HENT:     Head: Normocephalic.     Right Ear: Tympanic membrane, ear canal and external ear normal.     Left Ear: Tympanic membrane, ear canal and external ear normal.     Nose: Nose normal. No mucosal edema or rhinorrhea.     Mouth/Throat:     Pharynx: Uvula midline. No oropharyngeal exudate.  Eyes:     Conjunctiva/sclera: Conjunctivae normal.  Neck:     Thyroid: No thyromegaly.     Trachea: Trachea normal. No tracheal tenderness or tracheal deviation.  Cardiovascular:     Rate and Rhythm: Normal rate and regular rhythm.     Heart sounds: Normal heart sounds, S1 normal and S2 normal. No murmur heard. Pulmonary:     Effort: No respiratory distress.     Breath  sounds: Normal breath sounds. No stridor. No wheezing or rales.  Lymphadenopathy:     Head:     Right side of head: No tonsillar adenopathy.     Left side of head: No tonsillar adenopathy.     Cervical: No cervical adenopathy.  Skin:    Findings: No erythema or rash.     Nails: There is no clubbing.  Neurological:     Mental Status: He is alert.     Diagnostics: Spirometry was performed and demonstrated an FEV1 of 2.54 at 110 % of predicted.  Results of blood tests obtained 04 January 2024 identified a.m. cortisol 3.7 UG/DL.  Should be noted that this was collected several days after receiving a systemic steroid.  Results of blood tests obtained 14 July 2023 identifies a random cortisol 5.4 UG/DL, PSA 1.0 NG/mL, TSH 5.519 IU/ML, free T4 1.12 NG/DL, testosterone  258 NG/DL   Assessment and Plan:   1. Asthma, moderate persistent, well-controlled   2. Perennial allergic rhinitis   3. LPRD (laryngopharyngeal reflux disease)   4. Viral respiratory infection   5. Low testosterone  in male   6. Low serum cortisol level   7. Other fatigue    1.  Restart treatment and prevention of airway inflammation:   A. Every night use the following:    - OTC Afrin - 1 spray single nostril; alternate nostril nightly   - Fluticasone  - 1 spray each nostril   - Azelastine  - 1 spray each nostril   B. Every morning use the following :   - Fluticasone  - 1 spray each nostril   - Azelastine  - 1 spray each nostril   C. Symbicort  160 - 2 inhalations 2 times per day  2. Restart treatment and prevention of reflux induced inflammation of airway:   A. Consolidate caffeine consumption  B. Omeprazole  40 mg - 1-2 times per day  C. Famotidine  40 mg - 1 tablet in evening  D. Replace all throat clearing with drinking/swallowing maneuver  3. If needed:   A. Symbicort  160 - 2 inhalations every 6 hours (replaces albuterol )  4. Needs appointment with endocrinology for low testosterone  and cortisol  5.  Influenza = Tamiflu. Covid = Paxlovid  6. Return to clinic in 6 months or earlier if problem  Stacey obviously has a viral respiratory tract infection that gave rise to pretty significant inflammation of his airway and he is working through this issue and he can continue on his plan for addressing inflammation of both his upper and lower airway and also reflux as noted above until he is a lot better and then he can once again attempt to taper off his medications for upper airway inflammation and reflux and just remain on Symbicort .  He needs an appointment with endocrinology to work through his low testosterone  and his low cortisol.  Certainly his most recent cortisol may have been a manifestation of using a systemic steroid prior to this collection but in June 2025 we did document a random cortisol that was low and that did not appear to have any extrinsic effect from administration of systemic steroids.  Camellia Denis, MD Allergy  / Immunology [facility]

## 2024-01-13 ENCOUNTER — Encounter: Payer: Self-pay | Admitting: Allergy and Immunology

## 2024-02-18 ENCOUNTER — Telehealth: Payer: Self-pay | Admitting: Allergy and Immunology

## 2024-02-18 NOTE — Telephone Encounter (Signed)
 Jose Cervantes is scheduled with [facility] for 05/23/24 at 2:00 pm with Dr. Mercie

## 2024-05-23 ENCOUNTER — Ambulatory Visit: Admitting: Endocrinology

## 2024-07-12 ENCOUNTER — Ambulatory Visit: Admitting: Allergy and Immunology
